# Patient Record
Sex: Female | Born: 1985 | Race: White | Hispanic: No | Marital: Married | Smoking: Never smoker
Health system: Southern US, Community
[De-identification: ages and names within clinical notes are randomized; demographics above are authoritative.]

## PROBLEM LIST (undated history)

## (undated) DIAGNOSIS — D649 Anemia, unspecified: Secondary | ICD-10-CM

## (undated) DIAGNOSIS — R519 Headache, unspecified: Secondary | ICD-10-CM

## (undated) DIAGNOSIS — I471 Supraventricular tachycardia, unspecified: Secondary | ICD-10-CM

## (undated) DIAGNOSIS — T7840XA Allergy, unspecified, initial encounter: Secondary | ICD-10-CM

## (undated) DIAGNOSIS — R87629 Unspecified abnormal cytological findings in specimens from vagina: Secondary | ICD-10-CM

## (undated) DIAGNOSIS — F319 Bipolar disorder, unspecified: Secondary | ICD-10-CM

## (undated) HISTORY — PX: WISDOM TOOTH EXTRACTION: SHX21

## (undated) HISTORY — DX: Bipolar disorder, unspecified: F31.9

## (undated) HISTORY — DX: Anemia, unspecified: D64.9

## (undated) HISTORY — DX: Allergy, unspecified, initial encounter: T78.40XA

---
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
## 2009-10-23 HISTORY — PX: LASIK: SHX215

## 2011-10-31 ENCOUNTER — Ambulatory Visit (INDEPENDENT_AMBULATORY_CARE_PROVIDER_SITE_OTHER): Payer: BC Managed Care – PPO

## 2011-10-31 DIAGNOSIS — R040 Epistaxis: Secondary | ICD-10-CM

## 2011-10-31 DIAGNOSIS — J019 Acute sinusitis, unspecified: Secondary | ICD-10-CM

## 2011-11-06 ENCOUNTER — Ambulatory Visit (INDEPENDENT_AMBULATORY_CARE_PROVIDER_SITE_OTHER): Payer: BC Managed Care – PPO

## 2011-11-06 DIAGNOSIS — G243 Spasmodic torticollis: Secondary | ICD-10-CM

## 2011-11-06 DIAGNOSIS — M542 Cervicalgia: Secondary | ICD-10-CM

## 2012-04-04 ENCOUNTER — Ambulatory Visit: Payer: Self-pay | Admitting: Obstetrics and Gynecology

## 2012-04-30 ENCOUNTER — Ambulatory Visit: Payer: Self-pay | Admitting: Obstetrics and Gynecology

## 2012-05-16 ENCOUNTER — Ambulatory Visit: Payer: Self-pay | Admitting: Obstetrics and Gynecology

## 2012-12-03 ENCOUNTER — Ambulatory Visit: Payer: BC Managed Care – PPO | Admitting: Family Medicine

## 2012-12-03 VITALS — BP 100/70 | HR 88 | Temp 98.7°F | Resp 16 | Ht 65.5 in | Wt 149.6 lb

## 2012-12-03 DIAGNOSIS — N926 Irregular menstruation, unspecified: Secondary | ICD-10-CM

## 2012-12-03 DIAGNOSIS — R5383 Other fatigue: Secondary | ICD-10-CM

## 2012-12-03 DIAGNOSIS — R5381 Other malaise: Secondary | ICD-10-CM

## 2012-12-03 LAB — POCT CBC
Granulocyte percent: 55.6 %G (ref 37–80)
HCT, POC: 38.3 % (ref 37.7–47.9)
Hemoglobin: 12.2 g/dL (ref 12.2–16.2)
Lymph, poc: 2.6 (ref 0.6–3.4)
MCH, POC: 28.2 pg (ref 27–31.2)
MCHC: 31.9 g/dL (ref 31.8–35.4)
MCV: 88.4 fL (ref 80–97)
MID (cbc): 0.4 (ref 0–0.9)
MPV: 7.6 fL (ref 0–99.8)
POC Granulocyte: 3.7 (ref 2–6.9)
POC LYMPH PERCENT: 38.6 %L (ref 10–50)
POC MID %: 5.8 %M (ref 0–12)
Platelet Count, POC: 431 10*3/uL — AB (ref 142–424)
RBC: 4.33 M/uL (ref 4.04–5.48)
RDW, POC: 13 %
WBC: 6.7 10*3/uL (ref 4.6–10.2)

## 2012-12-03 LAB — POCT URINE PREGNANCY: Preg Test, Ur: NEGATIVE

## 2012-12-03 NOTE — Patient Instructions (Addendum)
Thank you for coming in today. Your vaginal bleeding is pretty common with the Implanon.  See GYN in 2-3 weeks if it continues.  You can increase your iron to 2-3x a day if you want.  If you want to lose 10lbs in 10 months eating 125 calories less a day will get there.  You can use myfitness pal website to track calories.  Come back as needed.

## 2012-12-03 NOTE — Progress Notes (Signed)
Gloria Christensen is a 27 y.o. female who presents to [facility] today for irregular menstrual bleeding. Patient has Nexplanon for birth control and previously has been having regular menstrual periods. She had a menstrual period that started on January 24 and on the 31st. However she had a second menstrual period starting February 5 continuing currently. She describes the bleeding as moderate using tampons several times a day.  She's concerned about this. She denies any pain cramping fevers or chills. She does describe some lightheadedness and  slight dizziness but feels consistent with prior episodes of anemia. She's currently taking ferrous gluconate and Centrum with iron.  She feels well otherwise no chest pains or palpitations.    PMH: Reviewed depression History  Substance Use Topics  . Smoking status: Never Smoker   . Smokeless tobacco: Not on file  . Alcohol Use: Not on file   ROS as above  Medications reviewed. Current Outpatient Prescriptions  Medication Sig Dispense Refill  . cetirizine (ZYRTEC) 10 MG tablet Take 10 mg by mouth daily.      . Etonogestrel (IMPLANON [place]) Inject into the skin.      . ferrous sulfate 325 (65 FE) MG tablet Take 325 mg by mouth daily with breakfast.      . lamoTRIgine (LAMICTAL) 100 MG tablet Take 100 mg by mouth daily.      . Multiple Vitamins-Minerals (MULTIVITAMIN WITH MINERALS) tablet Take 1 tablet by mouth daily.       No current facility-administered medications for this visit.    Exam:  BP 100/70  Pulse 88  Temp(Src) 98.7 F (37.1 C) (Oral)  Resp 16  Ht 5' 5.5" (1.664 m)  Wt 149 lb 9.6 oz (67.858 kg)  BMI 24.51 kg/m2  SpO2 100%  LMP 11/27/2012 Gen: Well NAD HEENT: EOMI,  MMM Lungs: CTABL Nl WOB Heart: RRR no MRG Abd: NABS, NT, ND  Exts: Non edematous BL  LE, warm and well perfused.   Results for orders placed in visit on 12/03/12 (from the past 72 hour(s))  POCT CBC     Status: Abnormal   Collection Time    12/03/12  7:22 PM      Result  Value Range   WBC 6.7  4.6 - 10.2 K/uL   Lymph, poc 2.6  0.6 - 3.4   POC LYMPH PERCENT 38.6  10 - 50 %L   MID (cbc) 0.4  0 - 0.9   POC MID % 5.8  0 - 12 %M   POC Granulocyte 3.7  2 - 6.9   Granulocyte percent 55.6  37 - 80 %G   RBC 4.33  4.04 - 5.48 M/uL   Hemoglobin 12.2  12.2 - 16.2 g/dL   HCT, POC 29.5  62.1 - 47.9 %   MCV 88.4  80 - 97 fL   MCH, POC 28.2  27 - 31.2 pg   MCHC 31.9  31.8 - 35.4 g/dL   RDW, POC 30.8     Platelet Count, POC 431 (*) 142 - 424 K/uL   MPV 7.6  0 - 99.8 fL  POCT URINE PREGNANCY     Status: None   Collection Time    12/03/12  7:24 PM      Result Value Range   Preg Test, Ur Negative      Assessment and Plan: 27 y.o. female with irregular menstrual bleeding. Very common side effect of Implanon. If not improving in 2-3 weeks consider followup with GYN for further evaluation and management.  Advised that she is anemic however is reasonable to take extra iron if she would like. Discussed warning signs or symptoms. Please see discharge instructions. Patient expresses understanding. Followup as needed

## 2012-12-04 NOTE — Progress Notes (Signed)
Note reviewed, and agree with documentation and plan.  

## 2012-12-06 ENCOUNTER — Encounter: Payer: Self-pay | Admitting: Obstetrics and Gynecology

## 2012-12-23 ENCOUNTER — Ambulatory Visit: Payer: Self-pay | Admitting: Obstetrics and Gynecology

## 2013-01-08 ENCOUNTER — Other Ambulatory Visit: Payer: Self-pay | Admitting: Obstetrics and Gynecology

## 2013-01-10 LAB — PAP IG, CT-NG, RFX HPV ASCU
Chlamydia Probe Amp: NEGATIVE
GC Probe Amp: NEGATIVE

## 2013-10-11 ENCOUNTER — Ambulatory Visit: Payer: BC Managed Care – PPO | Admitting: Internal Medicine

## 2013-10-11 VITALS — BP 102/60 | HR 84 | Temp 98.8°F | Resp 18 | Ht 67.0 in | Wt 148.0 lb

## 2013-10-11 DIAGNOSIS — F439 Reaction to severe stress, unspecified: Secondary | ICD-10-CM

## 2013-10-11 DIAGNOSIS — R51 Headache: Secondary | ICD-10-CM

## 2013-10-11 DIAGNOSIS — R490 Dysphonia: Secondary | ICD-10-CM

## 2013-10-11 DIAGNOSIS — J039 Acute tonsillitis, unspecified: Secondary | ICD-10-CM

## 2013-10-11 LAB — POCT RAPID STREP A (OFFICE): Rapid Strep A Screen: NEGATIVE

## 2013-10-11 MED ORDER — AZITHROMYCIN 250 MG PO TABS: ORAL_TABLET | ORAL | Status: DC

## 2013-10-11 NOTE — Progress Notes (Signed)
   Subjective:    Patient ID: Gloria Christensen, female    DOB: May 08, 1986, 27 y.o.   MRN: [id number]  HPI Has recurrent ST, exposed to strep, is a Engineer, site. No cough, cp, sob.   Review of Systems     Objective:   Physical Exam  Vitals reviewed. Constitutional: She is oriented to person, place, and time. She appears well-developed and well-nourished. No distress.  HENT:  Head: Normocephalic.  Right Ear: External ear normal.  Left Ear: External ear normal.  Nose: Mucosal edema, rhinorrhea and sinus tenderness present. Right sinus exhibits no maxillary sinus tenderness. Left sinus exhibits no maxillary sinus tenderness.  Mouth/Throat: Mucous membranes are normal. No uvula swelling. Posterior oropharyngeal erythema present.  Cardiovascular: Normal rate and normal heart sounds.   Pulmonary/Chest: Effort normal and breath sounds normal.  Musculoskeletal: Normal range of motion.  Neurological: She is alert and oriented to person, place, and time. She exhibits normal muscle tone. Coordination normal.  Skin: No rash noted.  Psychiatric: She has a normal mood and affect.    RST Results for orders placed in visit on 10/11/13  POCT RAPID STREP A (OFFICE)      Result Value Range   Rapid Strep A Screen Negative  Negative        Assessment & Plan:  Tonsillitis/Stress Zpak if worse

## 2013-10-11 NOTE — Patient Instructions (Addendum)
Tonsillitis Tonsillitis is an infection of the throat that causes the tonsils to become red, tender, and swollen. Tonsils are collections of lymphoid tissue at the back of the throat. Each tonsil has crevices (crypts). Tonsils help fight nose and throat infections and keep infection from spreading to other parts of the body for the first 18 months of life.  CAUSES Sudden (acute) tonsillitis is usually caused by infection with streptococcal bacteria. Long-lasting (chronic) tonsillitis occurs when the crypts of the tonsils become filled with pieces of food and bacteria, which makes it easy for the tonsils to become repeatedly infected. SYMPTOMS  Symptoms of tonsillitis include:  A sore throat, with possible difficulty swallowing.  White patches on the tonsils.  Fever.  Tiredness.  New episodes of snoring during sleep, when you did not snore before.  Small, foul-smelling, yellowish-white pieces of material (tonsilloliths) that you occasionally cough up or spit out. The tonsilloliths can also cause you to have bad breath. DIAGNOSIS Tonsillitis can be diagnosed through a physical exam. Diagnosis can be confirmed with the results of lab tests, including a throat culture. TREATMENT  The goals of tonsillitis treatment include the reduction of the severity and duration of symptoms and prevention of associated conditions. Symptoms of tonsillitis can be improved with the use of steroids to reduce the swelling. Tonsillitis caused by bacteria can be treated with antibiotics. Usually, treatment with antibiotics is started before the cause of the tonsillitis is known. However, if it is determined that the cause is not bacterial, antibiotics will not treat the tonsillitis. If attacks of tonsillitis are severe and frequent, your caregiver may recommend surgery to remove the tonsils (tonsillectomy). HOME CARE INSTRUCTIONS   Rest as much as possible and get plenty of sleep.  Drink plenty of fluids. While the  throat is very sore, eat soft foods or liquids, such as sherbet, soups, or instant breakfast drinks.  Eat frozen ice pops.  Gargle with a warm or cold liquid to help soothe the throat. Mix 1/4 teaspoon of salt and 1/4 teaspoon of baking soda in in 8 oz of water. SEEK MEDICAL CARE IF:   Large, tender lumps develop in your neck.  A rash develops.  A green, yellow-brown, or bloody substance is coughed up.  You are unable to swallow liquids or food for 24 hours.  You notice that only one of the tonsils is swollen. SEEK IMMEDIATE MEDICAL CARE IF:   You develop any new symptoms such as vomiting, severe headache, stiff neck, chest pain, or trouble breathing or swallowing.  You have severe throat pain along with drooling or voice changes.  You have severe pain, unrelieved with recommended medications.  You are unable to fully open the mouth.  You develop redness, swelling, or severe pain anywhere in the neck.  You have a fever. MAKE SURE YOU:   Understand these instructions.  Will watch your condition.  Will get help right away if you are not doing well or get worse. Document Released: 07/19/2005 Document Revised: 06/11/2013 Document Reviewed: 03/28/2013 [facility] Patient Information 2014 [place], [place]. Insomnia Insomnia is frequent trouble falling and/or staying asleep. Insomnia can be a long term problem or a short term problem. Both are common. Insomnia can be a short term problem when the wakefulness is related to a certain stress or worry. Long term insomnia is often related to ongoing stress during waking hours and/or poor sleeping habits. Overtime, sleep deprivation itself can make the problem worse. Every little thing feels more severe because you are  overtired and your ability to cope is decreased. CAUSES   Stress, anxiety, and depression.  Poor sleeping habits.  Distractions such as TV in the bedroom.  Naps close to bedtime.  Engaging in emotionally charged  conversations before bed.  Technical reading before sleep.  Alcohol and other sedatives. They may make the problem worse. They can hurt normal sleep patterns and normal dream activity.  Stimulants such as caffeine for several hours prior to bedtime.  Pain syndromes and shortness of breath can cause insomnia.  Exercise late at night.  Changing time zones may cause sleeping problems (jet lag). It is sometimes helpful to have someone observe your sleeping patterns. They should look for periods of not breathing during the night (sleep apnea). They should also look to see how long those periods last. If you live alone or observers are uncertain, you can also be observed at a sleep clinic where your sleep patterns will be professionally monitored. Sleep apnea requires a checkup and treatment. Give your caregivers your medical history. Give your caregivers observations your family has made about your sleep.  SYMPTOMS   Not feeling rested in the morning.  Anxiety and restlessness at bedtime.  Difficulty falling and staying asleep. TREATMENT   Your caregiver may prescribe treatment for an underlying medical disorders. Your caregiver can give advice or help if you are using alcohol or other drugs for self-medication. Treatment of underlying problems will usually eliminate insomnia problems.  Medications can be prescribed for short time use. They are generally not recommended for lengthy use.  Over-the-counter sleep medicines are not recommended for lengthy use. They can be habit forming.  You can promote easier sleeping by making lifestyle changes such as:  Using relaxation techniques that help with breathing and reduce muscle tension.  Exercising earlier in the day.  Changing your diet and the time of your last meal. No night time snacks.  Establish a regular time to go to bed.  Counseling can help with stressful problems and worry.  Soothing music and white noise may be helpful if  there are background noises you cannot remove.  Stop tedious detailed work at least one hour before bedtime. HOME CARE INSTRUCTIONS   Keep a diary. Inform your caregiver about your progress. This includes any medication side effects. See your caregiver regularly. Take note of:  Times when you are asleep.  Times when you are awake during the night.  The quality of your sleep.  How you feel the next day. This information will help your caregiver care for you.  Get out of bed if you are still awake after 15 minutes. Read or do some quiet activity. Keep the lights down. Wait until you feel sleepy and go back to bed.  Keep regular sleeping and waking hours. Avoid naps.  Exercise regularly.  Avoid distractions at bedtime. Distractions include watching television or engaging in any intense or detailed activity like attempting to balance the household checkbook.  Develop a bedtime ritual. Keep a familiar routine of bathing, brushing your teeth, climbing into bed at the same time each night, listening to soothing music. Routines increase the success of falling to sleep faster.  Use relaxation techniques. This can be using breathing and muscle tension release routines. It can also include visualizing peaceful scenes. You can also help control troubling or intruding thoughts by keeping your mind occupied with boring or repetitive thoughts like the old concept of counting sheep. You can make it more creative like imagining planting one beautiful flower  after another in your backyard garden.  During your day, work to eliminate stress. When this is not possible use some of the previous suggestions to help reduce the anxiety that accompanies stressful situations. MAKE SURE YOU:   Understand these instructions.  Will watch your condition.  Will get help right away if you are not doing well or get worse. Document Released: 10/06/2000 Document Revised: 01/01/2012 Document Reviewed:  11/06/2007 [facility] Patient Information 2014 [place], [place]. Stress Stress-related medical problems are becoming increasingly common. The body has a built-in physical response to stressful situations. Faced with pressure, challenge or danger, we need to react quickly. Our bodies release hormones such as cortisol and adrenaline to help do this. These hormones are part of the "fight or flight" response and affect the metabolic rate, heart rate and blood pressure, resulting in a heightened, stressed state that prepares the body for optimum performance in dealing with a stressful situation. It is likely that early man required these mechanisms to stay alive, but usually modern stresses do not call for this, and the same hormones released in today's world can damage health and reduce coping ability. CAUSES  Pressure to perform at work, at school or in sports.  Threats of physical violence.  Money worries.  Arguments.  Family conflicts.  Divorce or separation from significant other.  Bereavement.  New job or unemployment.  Changes in location.  Alcohol or drug abuse. SOMETIMES, THERE IS NO PARTICULAR REASON FOR DEVELOPING STRESS. Almost all people are at risk of being stressed at some time in their lives. It is important to know that some stress is temporary and some is long term.  Temporary stress will go away when a situation is resolved. Most people can cope with short periods of stress, and it can often be relieved by relaxing, taking a walk, chatting through issues with friends, or having a good night's sleep.  Chronic (long-term, continuous) stress is much harder to deal with. It can be psychologically and emotionally damaging. It can be harmful both for an individual and for friends and family. SYMPTOMS Everyone reacts to stress differently. There are some common effects that help [place] recognize it. In times of extreme stress, people may:  Shake uncontrollably.  Breathe faster  and deeper than normal (hyperventilate).  Vomit.  For people with asthma, stress can trigger an attack.  For some people, stress may trigger migraine headaches, ulcers, and body pain. PHYSICAL EFFECTS OF STRESS MAY INCLUDE:  Loss of energy.  Skin problems.  Aches and pains resulting from tense muscles, including neck ache, backache and tension headaches.  Increased pain from arthritis and other conditions.  Irregular heart beat (palpitations).  Periods of irritability or anger.  Apathy or depression.  Anxiety (feeling uptight or worrying).  Unusual behavior.  Loss of appetite.  Comfort eating.  Lack of concentration.  Loss of, or decreased, sex-drive.  Increased smoking, drinking, or recreational drug use.  For women, missed periods.  Ulcers, joint pain, and muscle pain. Post-traumatic stress is the stress caused by any serious accident, strong emotional damage, or extremely difficult or violent experience such as rape or war. Post-traumatic stress victims can experience mixtures of emotions such as fear, shame, depression, guilt or anger. It may include recurrent memories or images that may be haunting. These feelings can last for weeks, months or even years after the traumatic event that triggered them. Specialized treatment, possibly with medicines and psychological therapies, is available. If stress is causing physical symptoms, severe distress or making  it difficult for you to function as normal, it is worth seeing your caregiver. It is important to remember that although stress is a usual part of life, extreme or prolonged stress can lead to other illnesses that will need treatment. It is better to visit a doctor sooner rather than later. Stress has been linked to the development of high blood pressure and heart disease, as well as insomnia and depression. There is no diagnostic test for stress since everyone reacts to it differently. But a caregiver will be able to  spot the physical symptoms, such as:  Headaches.  Shingles.  Ulcers. Emotional distress such as intense worry, low mood or irritability should be detected when the doctor asks pertinent questions to identify any underlying problems that might be the cause. In case there are physical reasons for the symptoms, the doctor may also want to do some tests to exclude certain conditions. If you feel that you are suffering from stress, try to identify the aspects of your life that are causing it. Sometimes you may not be able to change or avoid them, but even a small change can have a positive ripple effect. A simple lifestyle change can make all the difference. STRATEGIES THAT CAN HELP DEAL WITH STRESS:  Delegating or sharing responsibilities.  Avoiding confrontations.  Learning to be more assertive.  Regular exercise.  Avoid using alcohol or street drugs to cope.  Eating a healthy, balanced diet, rich in fruit and vegetables and proteins.  Finding humor or absurdity in stressful situations.  Never taking on more than you know you can handle comfortably.  Organizing your time better to get as much done as possible.  Talking to friends or family and sharing your thoughts and fears.  Listening to music or relaxation tapes.  Tensing and then relaxing your muscles, starting at the toes and working up to the head and neck. If you think that you would benefit from help, either in identifying the things that are causing your stress or in learning techniques to help you relax, see a caregiver who is capable of helping you with this. Rather than relying on medications, it is usually better to try and identify the things in your life that are causing stress and try to deal with them. There are many techniques of managing stress including counseling, psychotherapy, aromatherapy, yoga, and exercise. Your caregiver can help you determine what is best for you. Document Released: 12/30/2002 Document  Revised: 01/01/2012 Document Reviewed: 11/26/2007 [facility] Patient Information 2014 [place], [place].

## 2015-04-21 ENCOUNTER — Encounter: Payer: Self-pay | Admitting: Podiatry

## 2015-04-21 ENCOUNTER — Ambulatory Visit (INDEPENDENT_AMBULATORY_CARE_PROVIDER_SITE_OTHER): Payer: BC Managed Care – PPO | Admitting: Podiatry

## 2015-04-21 VITALS — BP 115/72 | HR 75 | Ht 65.5 in | Wt 159.0 lb

## 2015-04-21 DIAGNOSIS — M216X1 Other acquired deformities of right foot: Secondary | ICD-10-CM

## 2015-04-21 DIAGNOSIS — M216X2 Other acquired deformities of left foot: Secondary | ICD-10-CM | POA: Diagnosis not present

## 2015-04-21 DIAGNOSIS — M21969 Unspecified acquired deformity of unspecified lower leg: Secondary | ICD-10-CM | POA: Diagnosis not present

## 2015-04-21 NOTE — Progress Notes (Signed)
Subjective: 29 year old female presents stating that all her family members had foot surgeries. Mother advised to have her feet checked.   Review of Systems - Negative.  Objective: Dermatologic: No abnormal skin lesions. Neurologic: All epicritic and tactile sensations grossly intact. Vascular: All pedal pulses are palpable bilateral. Orthopedic: Medially protruding Navicular bone bilateral. Elevated first ray bilateral. Hyperpronation foot with weight bearing bilateral. Normal ankle joint motion.   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 Compensatory STJ hyperpronation bilateral.  Plan: Reviewed clinical findings and available treatment options. May benefit from Custom orthotics. Also reviewed Cotton osteotomy with bone graft to plantar flex the first ray.

## 2015-04-21 NOTE — Patient Instructions (Signed)
Evaluation of both feet reveal elevated first Metatarsal bone.  May benefit from Custom orthotics.

## 2015-05-07 ENCOUNTER — Encounter: Payer: Self-pay | Admitting: Podiatry

## 2015-05-07 ENCOUNTER — Ambulatory Visit (INDEPENDENT_AMBULATORY_CARE_PROVIDER_SITE_OTHER): Payer: BC Managed Care – PPO | Admitting: Podiatry

## 2015-05-07 VITALS — BP 126/73 | HR 89

## 2015-05-07 DIAGNOSIS — M216X9 Other acquired deformities of unspecified foot: Secondary | ICD-10-CM

## 2015-05-07 DIAGNOSIS — M216X1 Other acquired deformities of right foot: Secondary | ICD-10-CM

## 2015-05-07 DIAGNOSIS — M21969 Unspecified acquired deformity of unspecified lower leg: Secondary | ICD-10-CM | POA: Diagnosis not present

## 2015-05-07 DIAGNOSIS — M216X2 Other acquired deformities of left foot: Secondary | ICD-10-CM

## 2015-05-07 NOTE — Progress Notes (Signed)
Subjective: 29 year old female presents to have both feet casted for orthotics. She is a Runner, broadcasting/film/videoteacher and on feet at work. She will require to ask permission to wear lace up tennis shoes.   Objective: No change in findings since the last visit.  Dermatologic: No abnormal skin lesions. Neurologic: All epicritic and tactile sensations grossly intact. Vascular: All pedal pulses are palpable bilateral. Orthopedic: Medially protruding Navicular bone bilateral. Elevated first ray bilateral. Hyperpronation foot with weight bearing bilateral. Normal ankle joint motion.  Radiographic examination reveal elevated first ray with short first metatarsal bones. Minimum change in CYMA line or lateral abduction angles.  No acute changes noted in osseous or articular surfaces.  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 Compensatory STJ hyperpronation bilateral.  Plan: Reviewed clinical findings and available treatment options. Both feet casted for orthotics. Patient was advised to wear lace up leather tennis shoes.

## 2015-05-07 NOTE — Patient Instructions (Addendum)
Casted for orthotics. All questions answered. Should wear firm leather tennis shoes at work.

## 2015-07-05 ENCOUNTER — Ambulatory Visit: Payer: BC Managed Care – PPO | Admitting: Podiatry

## 2015-11-09 ENCOUNTER — Emergency Department (HOSPITAL_COMMUNITY)
Admission: EM | Admit: 2015-11-09 | Discharge: 2015-11-09 | Disposition: A | Discharge status: Home / Self Care | Payer: BC Managed Care – PPO | Source: Home / Self Care | Attending: Emergency Medicine | Admitting: Emergency Medicine

## 2015-11-09 ENCOUNTER — Encounter (HOSPITAL_COMMUNITY): Payer: Self-pay | Admitting: Emergency Medicine

## 2015-11-09 DIAGNOSIS — G44209 Tension-type headache, unspecified, not intractable: Secondary | ICD-10-CM

## 2015-11-09 MED ORDER — KETOROLAC TROMETHAMINE 30 MG/ML IJ SOLN
30.0000 mg | Freq: Once | INTRAMUSCULAR | Status: AC
  Administered 2015-11-09: 30 mg via INTRAMUSCULAR

## 2015-11-09 MED ORDER — KETOROLAC TROMETHAMINE 10 MG PO TABS: 10.0000 mg | ORAL_TABLET | Freq: Four times a day (QID) | ORAL | Status: DC | PRN

## 2015-11-09 MED ORDER — KETOROLAC TROMETHAMINE 30 MG/ML IJ SOLN
INTRAMUSCULAR | Status: AC
  Filled 2015-11-09: qty 1

## 2015-11-09 NOTE — ED Provider Notes (Signed)
CSN: [id number]     Arrival date & time 11/09/15  1859 History   First MD Initiated Contact with Patient 11/09/15 1916     Chief Complaint  Patient presents with  . Migraine   (Consider location/radiation/quality/duration/timing/severity/associated sxs/prior Treatment) HPI Headache onset today 3pm. 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 usually able to take OTC meds and sleep and pain gets better. Pain is across forehead. No vision disturbance. Did get a ride to clinic.  Past Medical History  Diagnosis Date  . Allergy   . Anemia    Past Surgical History  Procedure Laterality Date  . Lasik  2011   No family history on file. Social History  Substance Use Topics  . Smoking status: Never Smoker   . Smokeless tobacco: Never Used  . Alcohol Use: None   OB History    No data available     Review of Systems ROS +'ve Headache  Denies:   NAUSEA, ABDOMINAL PAIN, CHEST PAIN, CONGESTION, DYSURIA, SHORTNESS OF BREATH  Allergies  Ceclor and Sulfur  Home Medications   Prior to Admission medications   Medication Sig Start Date End Date Taking? Authorizing Provider  cetirizine (ZYRTEC) 10 MG tablet Take 10 mg by mouth daily.    Historical Provider, MD  Etonogestrel (IMPLANON [place]) Inject into the skin.    Historical Provider, MD  ferrous sulfate 325 (65 FE) MG tablet Take 325 mg by mouth daily with breakfast.    Historical Provider, MD  lamoTRIgine (LAMICTAL) 100 MG tablet Take 100 mg by mouth daily.    Historical Provider, MD  Multiple Vitamins-Minerals (MULTIVITAMIN WITH MINERALS) tablet Take 1 tablet by mouth daily.    Historical Provider, MD  PRESCRIPTION MEDICATION Low dose birth control pill    Historical Provider, MD   Meds Ordered and Administered this Visit   Medications  ketorolac (TORADOL) 30 MG/ML injection 30 mg (not administered)    BP 126/80 mmHg  Pulse 85  Temp(Src) 98.2 F (36.8 C) (Oral)  Resp 12  SpO2 100%  LMP 11/02/2015 No data  found.   Physical Exam  Constitutional: She is oriented to person, place, and time. She appears well-developed and well-nourished.  HENT:  Head: Normocephalic and atraumatic.  Right Ear: External ear normal.  Left Ear: External ear normal.  Mouth/Throat: Oropharynx is clear and moist.  Eyes: Conjunctivae are normal.  Neck: Normal range of motion. Neck supple.  Cardiovascular: Normal rate.   Pulmonary/Chest: Effort normal and breath sounds normal.  Musculoskeletal: Normal range of motion.  Neurological: She is alert and oriented to person, place, and time. No cranial nerve deficit.  Skin: Skin is warm and dry.  Psychiatric: She has a normal mood and affect. Her behavior is normal. Judgment and thought content normal.  Nursing note and vitals reviewed.   ED Course  Procedures (including critical care time)  Labs Review Labs Reviewed - No data to display  Imaging Review No results found.   Visual Acuity Review  Right Eye Distance:   Left Eye Distance:   Bilateral Distance:    Right Eye Near:   Left Eye Near:    Bilateral Near:        After toradol, pain is much better MDM  No diagnosis found. Patient is advised to continue home symptomatic treatment. Prescription for toradol sent pharmacy patient has indicated. Patient is advised that if there are new or worsening symptoms or attend the emergency department, or contact primary care provider. Instructions of  care provided discharged home in stable condition.  THIS NOTE WAS GENERATED USING A VOICE RECOGNITION SOFTWARE PROGRAM. ALL REASONABLE EFFORTS  WERE MADE TO PROOFREAD THIS DOCUMENT FOR ACCURACY.     Tharon Aquas, PA 11/09/15 2008

## 2015-11-09 NOTE — Discharge Instructions (Signed)
Tension Headache A tension headache is pain, pressure, or aching that is felt over the front and sides of your head. These headaches can last from 30 minutes to several days. HOME CARE Managing Pain  Take over-the-counter and prescription medicines only as told by your doctor.  Lie down in a dark, quiet room when you have a headache.  If directed, apply ice to your head and neck area:  Put ice in a plastic bag.  Place a towel between your skin and the bag.  Leave the ice on for 20 minutes, 2-3 times per day.  Use a heating pad or a hot shower to apply heat to your head and neck area as told by your doctor. Eating and Drinking  Eat meals on a regular schedule.  Do not drink a lot of alcohol.  Do not use a lot of caffeine, or stop using caffeine. General Instructions  Keep all follow-up visits as told by your doctor. This is important.  Keep a journal to find out if certain things bring on headaches. For example, write down:  What you eat and drink.  How much sleep you get.  Any change to your diet or medicines.  Try getting a massage, or doing other things that help you to relax.  Lessen stress.  Sit up straight. Do not tighten (tense) your muscles.  Do not use tobacco products. This includes cigarettes, chewing tobacco, or e-cigarettes. If you need help quitting, ask your doctor.  Exercise regularly as told by your doctor.  Get enough sleep. This may mean 7-9 hours of sleep. GET HELP IF:  Your symptoms are not helped by medicine.  You have a headache that feels different from your usual headache.  You feel sick to your stomach (nauseous) or you throw up (vomit).  You have a fever. GET HELP RIGHT AWAY IF:  Your headache becomes very bad.  You keep throwing up.  You have a stiff neck.  You have trouble seeing.  You have trouble speaking.  You have pain in your eye or ear.  Your muscles are weak or you lose muscle control.  You lose your balance  or you have trouble walking.  You feel like you will pass out (faint) or you pass out.  You have confusion.   This information is not intended to replace advice given to you by your health care provider. Make sure you discuss any questions you have with your health care provider.   Document Released: 01/03/2010 Document Revised: 06/30/2015 Document Reviewed: 02/01/2015 Elsevier Interactive Patient Education 2016 [employer]  

## 2015-11-09 NOTE — ED Notes (Signed)
Pt here with possible tension headache that started today @ 3pm Headache is constant, frontal area radiating to scalp area] Slight nausea noted with vomiting, sensitivity to light or visual disturbance Tried Excedrin, iron supplements without relief Pt is a Engineer, site

## 2015-12-07 ENCOUNTER — Ambulatory Visit (INDEPENDENT_AMBULATORY_CARE_PROVIDER_SITE_OTHER): Payer: BC Managed Care – PPO | Admitting: Urgent Care

## 2015-12-07 VITALS — BP 122/72 | HR 102 | Temp 100.3°F | Resp 18 | Ht 67.0 in | Wt 161.0 lb

## 2015-12-07 DIAGNOSIS — J302 Other seasonal allergic rhinitis: Secondary | ICD-10-CM | POA: Diagnosis not present

## 2015-12-07 DIAGNOSIS — R059 Cough, unspecified: Secondary | ICD-10-CM

## 2015-12-07 DIAGNOSIS — J029 Acute pharyngitis, unspecified: Secondary | ICD-10-CM

## 2015-12-07 DIAGNOSIS — J3489 Other specified disorders of nose and nasal sinuses: Secondary | ICD-10-CM

## 2015-12-07 DIAGNOSIS — R05 Cough: Secondary | ICD-10-CM | POA: Diagnosis not present

## 2015-12-07 LAB — POCT RAPID STREP A (OFFICE): Rapid Strep A Screen: NEGATIVE

## 2015-12-07 MED ORDER — HYDROCODONE-HOMATROPINE 5-1.5 MG/5ML PO SYRP: 5.0000 mL | ORAL_SOLUTION | Freq: Every evening | ORAL | Status: DC | PRN

## 2015-12-07 MED ORDER — BENZONATATE 100 MG PO CAPS: 100.0000 mg | ORAL_CAPSULE | Freq: Three times a day (TID) | ORAL | Status: DC | PRN

## 2015-12-07 NOTE — Patient Instructions (Signed)

## 2015-12-07 NOTE — Progress Notes (Signed)
    MRN: [id number] DOB: 1985/10/28  Subjective:   Gloria Christensen is a 30 y.o. female presenting for chief complaint of Sore Throat and Fever  Reports 1 day history of sore throat, mild cough, sinus congestion, sinus pain, achy teeth, difficulty swallowing. Patient works as a Dispensing optician, would like to r/o strep as suggested by the school nurse. She has a history of seasonal allergies, managed with Zyrtec. Denies smoking cigarettes.  Gloria Christensen has a current medication list which includes the following prescription(s): cetirizine, lamotrigine, magnesium gluconate, multivitamin with minerals, norgestimate-ethinyl estradiol, and ketorolac. Also is allergic to ceclor and sulfur.  Gloria Christensen  has a past medical history of Allergy and Anemia. Also  has past surgical history that includes LASIK (2011).  Objective:   Vitals: BP 122/72 mmHg  Pulse 102  Temp(Src) 100.3 F (37.9 C)  Resp 18  Ht  (1.702 m)  Wt 161 lb (73.029 kg)  BMI 25.21 kg/m2  SpO2 99%  LMP 11/30/2015  Physical Exam  Constitutional: She is oriented to person, place, and time. She appears well-developed and well-nourished.  HENT:  TM's flat bilaterally, no effusions or erythema. Nasal turbinates pink and moist with moderate amount of thick clear mucus. Frontal sinus tenderness. Postnasal drip present, without oropharyngeal exudates, erythema or abscesses.  Eyes: Right eye exhibits no discharge. Left eye exhibits no discharge. No scleral icterus.  Neck: Normal range of motion. Neck supple.  Cardiovascular: Normal rate, regular rhythm and intact distal pulses.  Exam reveals no gallop and no friction rub.   No murmur heard. Pulmonary/Chest: No respiratory distress. She has no wheezes. She has no rales.  Lymphadenopathy:    She has cervical adenopathy (bilateral, anterior).  Neurological: She is alert and oriented to person, place, and time.  Skin: Skin is warm and dry.   Results for orders placed or performed in  visit on 12/07/15 (from the past 24 hour(s))  POCT rapid strep A     Status: None   Collection Time: 12/07/15  5:53 PM  Result Value Ref Range   Rapid Strep A Screen Negative Negative    Assessment and Plan :   1. Acute pharyngitis, unspecified etiology 2. Sore throat 3. Cough - Likely viral in etiology. Recommended supportive care, rtc in 1 week if no improvement.  4. Sinus pain 5. Seasonal allergies - Continue Zyrtec, allergies probably worsened by current illness. RTC if this does not improve with supportive care.  Wallis Bamberg, PA-C [facility] [phone] 12/07/2015 5:21 PM

## 2015-12-09 ENCOUNTER — Encounter: Payer: Self-pay | Admitting: Urgent Care

## 2015-12-09 ENCOUNTER — Ambulatory Visit (INDEPENDENT_AMBULATORY_CARE_PROVIDER_SITE_OTHER): Payer: BC Managed Care – PPO | Admitting: Family Medicine

## 2015-12-09 VITALS — BP 110/70 | HR 99 | Temp 98.6°F | Resp 18 | Wt 162.4 lb

## 2015-12-09 DIAGNOSIS — J069 Acute upper respiratory infection, unspecified: Secondary | ICD-10-CM

## 2015-12-09 DIAGNOSIS — J029 Acute pharyngitis, unspecified: Secondary | ICD-10-CM

## 2015-12-09 DIAGNOSIS — R509 Fever, unspecified: Secondary | ICD-10-CM | POA: Diagnosis not present

## 2015-12-09 LAB — POCT CBC
Granulocyte percent: 81.2 %G — AB (ref 37–80)
HCT, POC: 36.7 % — AB (ref 37.7–47.9)
Hemoglobin: 12.5 g/dL (ref 12.2–16.2)
Lymph, poc: 1.2 (ref 0.6–3.4)
MCH, POC: 29.2 pg (ref 27–31.2)
MCHC: 34.2 g/dL (ref 31.8–35.4)
MCV: 85.5 fL (ref 80–97)
MID (cbc): 0.4 (ref 0–0.9)
MPV: 6.5 fL (ref 0–99.8)
POC Granulocyte: 6.7 (ref 2–6.9)
POC LYMPH PERCENT: 14.1 %L (ref 10–50)
POC MID %: 4.7 %M (ref 0–12)
Platelet Count, POC: 329 10*3/uL (ref 142–424)
RBC: 4.29 M/uL (ref 4.04–5.48)
RDW, POC: 12.7 %
WBC: 8.3 10*3/uL (ref 4.6–10.2)

## 2015-12-09 LAB — CULTURE, GROUP A STREP: Organism ID, Bacteria: NORMAL

## 2015-12-09 LAB — POCT RAPID STREP A (OFFICE): Rapid Strep A Screen: NEGATIVE

## 2015-12-09 MED ORDER — AZITHROMYCIN 250 MG PO TABS: ORAL_TABLET | ORAL | Status: DC

## 2015-12-09 MED ORDER — MAGIC MOUTHWASH W/LIDOCAINE: 5.0000 mL | Freq: Four times a day (QID) | ORAL | Status: DC | PRN

## 2015-12-09 NOTE — Progress Notes (Addendum)
Subjective:  By signing my name below, I, Rawaa Al Rifaie, attest that this documentation has been prepared under the direction and in the presence of Meredith Staggers, MD.  Broadus John, Medical Scribe. 12/09/2015.  11:08 AM.   Patient ID: Gloria Christensen, female    DOB: 1986/04/23, 30 y.o.   MRN: [id number]  Chief Complaint  Patient presents with  . Sore Throat    x4 days, throat is still painful    HPI HPI Comments: Gloria Christensen is a 30 y.o. female who presents to [facility] complaining of sore throat, onset 4 days ago. Pt was seen 2 days ago with a sore throat and fever starting one day prior, accompanied by cough and congestion. She had a negative rapid strep test, suspected viral illness. She also had a negative throat culture. Symptomatic care was discussed.  Today, pt reports that her sore throat is consistent, and has gotten worse. She indicates that she has sever difficulty swallowing, and has associated burning sensation with it. She still has fever or 99 degrees this morning, her temperature is normal here at the office. Pt also still present with cough that is only productive of green-yellow mucus at shower time, in addition to congestion. She reports no sick contact. Pt is compliant with taking the hycodan cough syrup at bed time, and reports finding relief with it for about 3 hours. She is also compliant with taking the tessalon perles, however she indicates that she does not find much relief with it. She is not UTD with the flu vaccine. She denies difficulty urinating.   Pt reports experiencing addiction symptom with sleeping medication, however no problems with narcotic medications.   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    Patient Active Problem List   Diagnosis Date Noted  . 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 Other malaise and fatigue 12/03/2012  . Irregular menstrual bleeding  12/03/2012   Past Medical History  Diagnosis Date  . Allergy   . Anemia    Past Surgical History  Procedure Laterality Date  . Lasik  2011   Allergies  Allergen Reactions  . Ceclor [Cefaclor] Hives  . Sulfur    Prior to Admission medications   Medication Sig Start Date End Date Taking? Authorizing Provider  benzonatate (TESSALON) 100 MG capsule Take 1-2 capsules (100-200 mg total) by mouth 3 (three) times daily as needed for cough. 12/07/15  Yes Wallis Bamberg, PA-C  cetirizine (ZYRTEC) 10 MG tablet Take 10 mg by mouth daily.   Yes Historical Provider, MD  HYDROcodone-homatropine (HYCODAN) 5-1.5 MG/5ML syrup Take 5 mLs by mouth at bedtime as needed. 12/07/15  Yes Wallis Bamberg, PA-C  lamoTRIgine (LAMICTAL) 100 MG tablet Take 100 mg by mouth daily.   Yes Historical Provider, MD  magnesium gluconate (MAGONATE) 30 MG tablet Take 30 mg by mouth 2 (two) times daily.   Yes Historical Provider, MD  Multiple Vitamins-Minerals (MULTIVITAMIN WITH MINERALS) tablet Take 1 tablet by mouth daily.   Yes Historical Provider, MD  norgestimate-ethinyl estradiol (ORTHO-CYCLEN,SPRINTEC,PREVIFEM) 0.25-35 MG-MCG tablet Take 1 tablet by mouth daily.   Yes Historical Provider, MD  ketorolac (TORADOL) 10 MG tablet Take 1 tablet (10 mg total) by mouth every 6 (six) hours as needed. Patient not taking: Reported on 12/07/2015 11/09/15   Tharon Aquas, PA   Social History   Social History  . Marital Status: Single    Spouse Name:  N/A  . Number of Children: N/A  . Years of Education: N/A   Occupational History  . Not on file.   Social History Main Topics  . Smoking status: Never Smoker   . Smokeless tobacco: Never Used  . Alcohol Use: Not on file  . Drug Use: Not on file  . Sexual Activity: Not on file   Other Topics Concern  . Not on file   Social History Narrative    Review of Systems  Constitutional: Positive for fever.  HENT: Positive for congestion, sore throat and trouble swallowing.     Respiratory: Positive for cough.   Genitourinary: Negative for difficulty urinating.      Objective:   Physical Exam  Constitutional: She is oriented to person, place, and time. She appears well-developed and well-nourished. No distress.  HENT:  Head: Normocephalic and atraumatic.  Question of faint exudate on the left tonsillar recess with some erythema BL. I do not see any abscess. No uvula shift. Does not appear to be significant hypertrophy.  Eyes: EOM are normal. Pupils are equal, round, and reactive to light.  Neck: Neck supple.  No lymphadenopathy.  Small tender node on the left neck.   Cardiovascular: Normal rate, regular rhythm and normal heart sounds.  Exam reveals no gallop and no friction rub.   No murmur heard. Pulmonary/Chest: Effort normal and breath sounds normal. No respiratory distress. She has no wheezes. She has no rales.  Abdominal: There is tenderness (Minimal right sided abdominal tenderness). There is no rebound and no guarding.  Neurological: She is alert and oriented to person, place, and time. No cranial nerve deficit.  Skin: Skin is warm and dry.  Psychiatric: She has a normal mood and affect. Her behavior is normal.  Nursing note and vitals reviewed.   Filed Vitals:   12/09/15 1049  BP: 110/70  Pulse: 99  Temp: 98.6 F (37 C)  TempSrc: Oral  Resp: 18  Weight: 162 lb 6.4 oz (73.664 kg)  SpO2: 98%   Results for orders placed or performed in visit on 12/09/15  POCT CBC  Result Value Ref Range   WBC 8.3 4.6 - 10.2 K/uL   Lymph, poc 1.2 0.6 - 3.4   POC LYMPH PERCENT 14.1 10 - 50 %L   MID (cbc) 0.4 0 - 0.9   POC MID % 4.7 0 - 12 %M   POC Granulocyte 6.7 2 - 6.9   Granulocyte percent 81.2 (A) 37 - 80 %G   RBC 4.29 4.04 - 5.48 M/uL   Hemoglobin 12.5 12.2 - 16.2 g/dL   HCT, POC 40.9 (A) 81.1 - 47.9 %   MCV 85.5 80 - 97 fL   MCH, POC 29.2 27 - 31.2 pg   MCHC 34.2 31.8 - 35.4 g/dL   RDW, POC 91.4 %   Platelet Count, POC 329 142 - 424 K/uL   MPV  6.5 0 - 99.8 fL  POCT rapid strep A  Result Value Ref Range   Rapid Strep A Screen Negative Negative       Assessment & Plan:   Gloria Christensen is a 30 y.o. female Pharyngitis - Plan: POCT rapid strep A, magic mouthwash w/lidocaine SOLN, azithromycin (ZITHROMAX) 250 MG tablet, DISCONTINUED: azithromycin (ZITHROMAX) 250 MG tablet  Acute upper respiratory infection  Fever and chills - Plan: POCT CBC, POCT rapid strep A  Still suspected viral illness, continue symptomatic care, prescription given for Magic mouthwash and discuss other sore throat care. However if not improving  the next few days, prescription for azithromycin to cover for secondary bacterial infection was prescribed. Side effects discussed and RTC precautions discussed.  Meds ordered this encounter  Medications  . magic mouthwash w/lidocaine SOLN    Sig: Take 5 mLs by mouth 4 (four) times daily as needed for mouth pain.    Dispense:  120 mL    Refill:  0    Ok to substitute ingredients per pharmacy usual "magic mouthwash" prep.  Marland Kitchen DISCONTD: azithromycin (ZITHROMAX) 250 MG tablet    Sig: Take 2 pills by mouth on day 1, then 1 pill by mouth per day on days 2 through 5.    Dispense:  6 tablet    Refill:  0  . azithromycin (ZITHROMAX) 250 MG tablet    Sig: Take 2 pills by mouth on day 1, then 1 pill by mouth per day on days 2 through 5.    Dispense:  6 tablet    Refill:  0   Patient Instructions   Magic mouthwash as needed throughout the day, ibuprofen (you can use the children's or liquid version with adult dose of  if needed)  4-6 hours, and the hydrocodone cough syrup that was prescribed can be used every 6 hours as needed.   Your blood count was overall reassuring, and strep test was negative again today. If not improving into the next 2 days, can start antibiotic that was prescribed today, but right now your symptoms still appear to be due to a virus. If you're having increasing swelling or difficulty keeping  fluids down or other worsening pain into this weekend, recommend recheck.  Return to the clinic or go to the nearest emergency room if any of your symptoms worsen or new symptoms occur.   Sore Throat A sore throat is pain, burning, irritation, or scratchiness of the throat. There is often pain or tenderness when swallowing or talking. A sore throat may be accompanied by other symptoms, such as coughing, sneezing, fever, and swollen neck glands. A sore throat is often the first sign of another sickness, such as a cold, flu, strep throat, or mononucleosis (commonly known as mono). Most sore throats go away without medical treatment. CAUSES  The most common causes of a sore throat include:  A viral infection, such as a cold, flu, or mono.  A bacterial infection, such as strep throat, tonsillitis, or whooping cough.  Seasonal allergies.  Dryness in the air.  Irritants, such as smoke or pollution.  Gastroesophageal reflux disease (GERD). HOME CARE INSTRUCTIONS   Only take over-the-counter medicines as directed by your caregiver.  Drink enough fluids to keep your urine clear or pale yellow.  Rest as needed.  Try using throat sprays, lozenges, or sucking on hard candy to ease any pain (if older than 4 years or as directed).  Sip warm liquids, such as broth, herbal tea, or warm water with honey to relieve pain temporarily. You may also eat or drink cold or frozen liquids such as frozen ice pops.  Gargle with salt water (mix 1 tsp salt with 8 oz of water).  Do not smoke and avoid secondhand smoke.  Put a cool-mist humidifier in your bedroom at night to moisten the air. You can also turn on a hot shower and sit in the bathroom with the door closed for 5-10 minutes. SEEK IMMEDIATE MEDICAL CARE IF:  You have difficulty breathing.  You are unable to swallow fluids, soft foods, or your saliva.  You have increased swelling  in the throat.  Your sore throat does not get better in 7  days.  You have nausea and vomiting.  You have a fever or persistent symptoms for more than 2-3 days.  You have a fever and your symptoms suddenly get worse. MAKE SURE YOU:   Understand these instructions.  Will watch your condition.  Will get help right away if you are not doing well or get worse.   This information is not intended to replace advice given to you by your health care provider. Make sure you discuss any questions you have with your health care provider.   Document Released: 11/16/2004 Document Revised: 10/30/2014 Document Reviewed: 06/16/2012 Elsevier Interactive Patient Education 2016 Else[employer] Inc[employer]  Upper Respiratory Infection, Adult Most upper respiratory infections (URIs) are a viral infection of the air passages leading to the lungs. A URI affects the nose, throat, and upper air passages. The most common type of URI is nasopharyngitis and is typically referred to as "the common cold." URIs run their course and usually go away on their own. Most of the time, a URI does not require medical attention, but sometimes a bacterial infection in the upper airways can follow a viral infection. This is called a secondary infection. Sinus and middle ear infections are common types of secondary upper respiratory infections. Bacterial pneumonia can also complicate a URI. A URI can worsen asthma and chronic obstructive pulmonary disease (COPD). Sometimes, these complications can require emergency medical care and may be life threatening.  CAUSES Almost all URIs are caused by viruses. A virus is a type of germ and can spread from one person to another.  RISKS FACTORS You may be at risk for a URI if:   You smoke.   You have chronic heart or lung disease.  You have a weakened defense (immune) system.   You are very young or very old.   You have nasal allergies or asthma.  You work in crowded or poorly ventilated areas.  You work in health care facilities or  schools. SIGNS AND SYMPTOMS  Symptoms typically develop 2-3 days after you come in contact with a cold virus. Most viral URIs last 7-10 days. However, viral URIs from the influenza virus (flu virus) can last 14-18 days and are typically more severe. Symptoms may include:   Runny or stuffy (congested) nose.   Sneezing.   Cough.   Sore throat.   Headache.   Fatigue.   Fever.   Loss of appetite.   Pain in your forehead, behind your eyes, and over your cheekbones (sinus pain).  Muscle aches.  DIAGNOSIS  Your health care provider may diagnose a URI by:  Physical exam.  Tests to check that your symptoms are not due to another condition such as:  Strep throat.  Sinusitis.  Pneumonia.  Asthma. TREATMENT  A URI goes away on its own with time. It cannot be cured with medicines, but medicines may be prescribed or recommended to relieve symptoms. Medicines may help:  Reduce your fever.  Reduce your cough.  Relieve nasal congestion. HOME CARE INSTRUCTIONS   Take medicines only as directed by your health care provider.   Gargle warm saltwater or take cough drops to comfort your throat as directed by your health care provider.  Use a warm mist humidifier or inhale steam from a shower to increase air moisture. This may make it easier to breathe.  Drink enough fluid to keep your urine clear or pale yellow.   Eat soups and  other clear broths and maintain good nutrition.   Rest as needed.   Return to work when your temperature has returned to normal or as your health care provider advises. You may need to stay home longer to avoid infecting others. You can also use a face mask and careful hand washing to prevent spread of the virus.  Increase the usage of your inhaler if you have asthma.   Do not use any tobacco products, including cigarettes, chewing tobacco, or electronic cigarettes. If you need help quitting, ask your health care provider. PREVENTION   The best way to protect yourself from getting a cold is to practice good hygiene.   Avoid oral or hand contact with people with cold symptoms.   Wash your hands often if contact occurs.  There is no clear evidence that vitamin C, vitamin E, echinacea, or exercise reduces the chance of developing a cold. However, it is always recommended to get plenty of rest, exercise, and practice good nutrition.  SEEK MEDICAL CARE IF:   You are getting worse rather than better.   Your symptoms are not controlled by medicine.   You have chills.  You have worsening shortness of breath.  You have brown or red mucus.  You have yellow or brown nasal discharge.  You have pain in your face, especially when you bend forward.  You have a fever.  You have swollen neck glands.  You have pain while swallowing.  You have white areas in the back of your throat. SEEK IMMEDIATE MEDICAL CARE IF:   You have severe or persistent:  Headache.  Ear pain.  Sinus pain.  Chest pain.  You have chronic lung disease and any of the following:  Wheezing.  Prolonged cough.  Coughing up blood.  A change in your usual mucus.  You have a stiff neck.  You have changes in your:  Vision.  Hearing.  Thinking.  Mood. MAKE SURE YOU:   Understand these instructions.  Will watch your condition.  Will get help right away if you are not doing well or get worse.   This information is not intended to replace advice given to you by your health care provider. Make sure you discuss any questions you have with your health care provider.   Document Released: 04/04/2001 Document Revised: 02/23/2015 Document Reviewed: 01/14/2014 Elsevier Interactive Patient Education [employer]       I personally performed the services described in this documentation, which was scribed in my presence. The recorded information has been reviewed and considered, and addended by me as needed.

## 2015-12-09 NOTE — Patient Instructions (Signed)
Magic mouthwash as needed throughout the day, ibuprofen (you can use the children's or liquid version with adult dose of 400mg  if needed)  4-6 hours, and the hydrocodone cough syrup that was prescribed can be used every 6 hours as needed.   Your blood count was overall reassuring, and strep test was negative again today. If not improving into the next 2 days, can start antibiotic that was prescribed today, but right now your symptoms still appear to be due to a virus. If you're having increasing swelling or difficulty keeping fluids down or other worsening pain into this weekend, recommend recheck.  Return to the clinic or go to the nearest emergency room if any of your symptoms worsen or new symptoms occur.   Sore Throat A sore throat is pain, burning, irritation, or scratchiness of the throat. There is often pain or tenderness when swallowing or talking. A sore throat may be accompanied by other symptoms, such as coughing, sneezing, fever, and swollen neck glands. A sore throat is often the first sign of another sickness, such as a cold, flu, strep throat, or mononucleosis (commonly known as mono). Most sore throats go away without medical treatment. CAUSES  The most common causes of a sore throat include:  A viral infection, such as a cold, flu, or mono.  A bacterial infection, such as strep throat, tonsillitis, or whooping cough.  Seasonal allergies.  Dryness in the air.  Irritants, such as smoke or pollution.  Gastroesophageal reflux disease (GERD). HOME CARE INSTRUCTIONS   Only take over-the-counter medicines as directed by your caregiver.  Drink enough fluids to keep your urine clear or pale yellow.  Rest as needed.  Try using throat sprays, lozenges, or sucking on hard candy to ease any pain (if older than 4 years or as directed).  Sip warm liquids, such as broth, herbal tea, or warm water with honey to relieve pain temporarily. You may also eat or drink cold or frozen  liquids such as frozen ice pops.  Gargle with salt water (mix 1 tsp salt with 8 oz of water).  Do not smoke and avoid secondhand smoke.  Put a cool-mist humidifier in your bedroom at night to moisten the air. You can also turn on a hot shower and sit in the bathroom with the door closed for 5-10 minutes. SEEK IMMEDIATE MEDICAL CARE IF:  You have difficulty breathing.  You are unable to swallow fluids, soft foods, or your saliva.  You have increased swelling in the throat.  Your sore throat does not get better in 7 days.  You have nausea and vomiting.  You have a fever or persistent symptoms for more than 2-3 days.  You have a fever and your symptoms suddenly get worse. MAKE SURE YOU:   Understand these instructions.  Will watch your condition.  Will get help right away if you are not doing well or get worse.   This information is not intended to replace advice given to you by your health care provider. Make sure you discuss any questions you have with your health care provider.   Document Released: 11/16/2004 Document Revised: 10/30/2014 Document Reviewed: 06/16/2012 Elsevier Interactive Patient Education 2016 [employer] Inc.  Upper Respiratory Infection, Adult Most upper respiratory infections (URIs) are a viral infection of the air passages leading to the lungs. A URI affects the nose, throat, and upper air passages. The most common type of URI is nasopharyngitis and is typically referred to as "the common cold." URIs run their course  and usually go away on their own. Most of the time, a URI does not require medical attention, but sometimes a bacterial infection in the upper airways can follow a viral infection. This is called a secondary infection. Sinus and middle ear infections are common types of secondary upper respiratory infections. Bacterial pneumonia can also complicate a URI. A URI can worsen asthma and chronic obstructive pulmonary disease (COPD). Sometimes, these  complications can require emergency medical care and may be life threatening.  CAUSES Almost all URIs are caused by viruses. A virus is a type of germ and can spread from one person to another.  RISKS FACTORS You may be at risk for a URI if:   You smoke.   You have chronic heart or lung disease.  You have a weakened defense (immune) system.   You are very young or very old.   You have nasal allergies or asthma.  You work in crowded or poorly ventilated areas.  You work in health care facilities or schools. SIGNS AND SYMPTOMS  Symptoms typically develop 2-3 days after you come in contact with a cold virus. Most viral URIs last 7-10 days. However, viral URIs from the influenza virus (flu virus) can last 14-18 days and are typically more severe. Symptoms may include:   Runny or stuffy (congested) nose.   Sneezing.   Cough.   Sore throat.   Headache.   Fatigue.   Fever.   Loss of appetite.   Pain in your forehead, behind your eyes, and over your cheekbones (sinus pain).  Muscle aches.  DIAGNOSIS  Your health care provider may diagnose a URI by:  Physical exam.  Tests to check that your symptoms are not due to another condition such as:  Strep throat.  Sinusitis.  Pneumonia.  Asthma. TREATMENT  A URI goes away on its own with time. It cannot be cured with medicines, but medicines may be prescribed or recommended to relieve symptoms. Medicines may help:  Reduce your fever.  Reduce your cough.  Relieve nasal congestion. HOME CARE INSTRUCTIONS   Take medicines only as directed by your health care provider.   Gargle warm saltwater or take cough drops to comfort your throat as directed by your health care provider.  Use a warm mist humidifier or inhale steam from a shower to increase air moisture. This may make it easier to breathe.  Drink enough fluid to keep your urine clear or pale yellow.   Eat soups and other clear broths and maintain  good nutrition.   Rest as needed.   Return to work when your temperature has returned to normal or as your health care provider advises. You may need to stay home longer to avoid infecting others. You can also use a face mask and careful hand washing to prevent spread of the virus.  Increase the usage of your inhaler if you have asthma.   Do not use any tobacco products, including cigarettes, chewing tobacco, or electronic cigarettes. If you need help quitting, ask your health care provider. PREVENTION  The best way to protect yourself from getting a cold is to practice good hygiene.   Avoid oral or hand contact with people with cold symptoms.   Wash your hands often if contact occurs.  There is no clear evidence that vitamin C, vitamin E, echinacea, or exercise reduces the chance of developing a cold. However, it is always recommended to get plenty of rest, exercise, and practice good nutrition.  SEEK MEDICAL CARE IF:  You are getting worse rather than better.   Your symptoms are not controlled by medicine.   You have chills.  You have worsening shortness of breath.  You have brown or red mucus.  You have yellow or brown nasal discharge.  You have pain in your face, especially when you bend forward.  You have a fever.  You have swollen neck glands.  You have pain while swallowing.  You have white areas in the back of your throat. SEEK IMMEDIATE MEDICAL CARE IF:   You have severe or persistent:  Headache.  Ear pain.  Sinus pain.  Chest pain.  You have chronic lung disease and any of the following:  Wheezing.  Prolonged cough.  Coughing up blood.  A change in your usual mucus.  You have a stiff neck.  You have changes in your:  Vision.  Hearing.  Thinking.  Mood. MAKE SURE YOU:   Understand these instructions.  Will watch your condition.  Will get help right away if you are not doing well or get worse.   This information is not  intended to replace advice given to you by your health care provider. Make sure you discuss any questions you have with your health care provider.   Document Released: 04/04/2001 Document Revised: 02/23/2015 Document Reviewed: 01/14/2014 Elsevier Interactive Patient Education [employer]

## 2016-02-11 ENCOUNTER — Ambulatory Visit (INDEPENDENT_AMBULATORY_CARE_PROVIDER_SITE_OTHER): Payer: BC Managed Care – PPO | Admitting: Physician Assistant

## 2016-02-11 VITALS — BP 108/70 | HR 104 | Temp 99.1°F | Resp 16 | Ht 65.5 in | Wt 162.4 lb

## 2016-02-11 DIAGNOSIS — A020 Salmonella enteritis: Secondary | ICD-10-CM | POA: Diagnosis not present

## 2016-02-11 DIAGNOSIS — R197 Diarrhea, unspecified: Secondary | ICD-10-CM | POA: Diagnosis not present

## 2016-02-11 LAB — POCT URINALYSIS DIP (MANUAL ENTRY)
Bilirubin, UA: NEGATIVE
Blood, UA: NEGATIVE
Glucose, UA: NEGATIVE
Leukocytes, UA: NEGATIVE
Nitrite, UA: NEGATIVE
Protein Ur, POC: NEGATIVE
Spec Grav, UA: 1.015
Urobilinogen, UA: 0.2
pH, UA: 6.5

## 2016-02-11 LAB — POCT CBC
Granulocyte percent: 69.7 %G (ref 37–80)
HCT, POC: 34.7 % — AB (ref 37.7–47.9)
Hemoglobin: 12.1 g/dL — AB (ref 12.2–16.2)
Lymph, poc: 2.1 (ref 0.6–3.4)
MCH, POC: 29.4 pg (ref 27–31.2)
MCHC: 35 g/dL (ref 31.8–35.4)
MCV: 84 fL (ref 80–97)
MID (cbc): 0.9 (ref 0–0.9)
MPV: 6.5 fL (ref 0–99.8)
POC Granulocyte: 6.8 (ref 2–6.9)
POC LYMPH PERCENT: 21.4 %L (ref 10–50)
POC MID %: 8.9 %M (ref 0–12)
Platelet Count, POC: 337 10*3/uL (ref 142–424)
RBC: 4.12 M/uL (ref 4.04–5.48)
RDW, POC: 12.7 %
WBC: 9.8 10*3/uL (ref 4.6–10.2)

## 2016-02-11 NOTE — Progress Notes (Signed)
02/16/2016 8:12 AM   DOB: 09-15-1986 / MRN: [id number]  SUBJECTIVE:  Gloria Christensen is a 30 y.o. female presenting for diarrhea that has been present for roughly 5 days now.  Associates lower abdominal pain before the episodes of diarrhea and the pain is relieved by defecation. She does not feel that she is getting worse or better.  Has been taking immodium with excellent relief of her symptoms.  Denies dysuria and abnormal vaginal discharge.  She has had no antibiotics in the last year.    She is allergic to ceclor and sulfur.   She  has a past medical history of Allergy and Anemia.    She  reports that she has never smoked. She has never used smokeless tobacco. She  has no sexual activity history on file. The patient  has past surgical history that includes LASIK (2011).  Her family history is not on file.  Review of Systems  Constitutional: Negative for fever and chills.  Cardiovascular: Negative for chest pain.  Gastrointestinal: Positive for nausea, abdominal pain and diarrhea. Negative for vomiting and constipation.  Genitourinary: Negative for dysuria, urgency and frequency.  Musculoskeletal: Negative for myalgias.  Skin: Negative for rash.  Neurological: Negative for dizziness and headaches.    Problem list and medications reviewed and updated by myself where necessary, and exist elsewhere in the encounter.   OBJECTIVE:  BP 108/70 mmHg  Pulse 104  Temp(Src) 99.1 F (37.3 C) (Oral)  Resp 16  Ht 5' 5.5" (1.664 m)  Wt 162 lb 6.4 oz (73.664 kg)  BMI 26.60 kg/m2  SpO2 98%  LMP 01/24/2016  Physical Exam  Constitutional: She is oriented to person, place, and time. She appears well-nourished. No distress.  Eyes: EOM are normal. Pupils are equal, round, and reactive to light.  Cardiovascular: Normal rate and regular rhythm.   Pulmonary/Chest: Effort normal and breath sounds normal.  Abdominal: She exhibits no distension.  Neurological: She is alert and oriented to person,  place, and time. No cranial nerve deficit. Gait normal.  Skin: Skin is dry. She is not diaphoretic. There is pallor.  Psychiatric: She has a normal mood and affect.  Vitals reviewed.  Results for orders placed or performed in visit on 02/11/16  COMPLETE METABOLIC PANEL WITH GFR  Result Value Ref Range   Sodium 136 135 - 146 mmol/L   Potassium 4.5 3.5 - 5.3 mmol/L   Chloride 100 98 - 110 mmol/L   CO2 26 20 - 31 mmol/L   Glucose, Bld 91 65 - 99 mg/dL   BUN 7 7 - 25 mg/dL   Creat 4.090.66 8.110.50 - 9.141.10 mg/dL   Total Bilirubin 0.3 0.2 - 1.2 mg/dL   Alkaline Phosphatase 54 33 - 115 U/L   AST 14 10 - 30 U/L   ALT 11 6 - 29 U/L   Total Protein 7.6 6.1 - 8.1 g/dL   Albumin 4.1 3.6 - 5.1 g/dL   Calcium 9.2 8.6 - 78.210.2 mg/dL   GFR, Est African American >89 >=60 mL/min   GFR, Est Non African American >89 >=60 mL/min  Gastrointestinal Pathogen Panel PCR  Result Value Ref Range   Campylobacter, PCR Not Detected    C. difficile Tox A/B, PCR Not Detected    E coli 0157, PCR Not Detected    E coli (ETEC) LT/ST PCR Not Detected    E coli (STEC) stx1/stx2, PCR Not Detected    Salmonella, PCR Detected (AA)    Shigella, PCR Not Detected  Norovirus, PCR Not Detected    Rotavirus A, PCR Not Detected    Giardia lamblia, PCR Not Detected    Cryptosporidium, PCR Not Detected   POCT CBC  Result Value Ref Range   WBC 9.8 4.6 - 10.2 K/uL   Lymph, poc 2.1 0.6 - 3.4   POC LYMPH PERCENT 21.4 10 - 50 %L   MID (cbc) 0.9 0 - 0.9   POC MID % 8.9 0 - 12 %M   POC Granulocyte 6.8 2 - 6.9   Granulocyte percent 69.7 37 - 80 %G   RBC 4.12 4.04 - 5.48 M/uL   Hemoglobin 12.1 (A) 12.2 - 16.2 g/dL   HCT, POC 16.1 (A) 09.6 - 47.9 %   MCV 84.0 80 - 97 fL   MCH, POC 29.4 27 - 31.2 pg   MCHC 35.0 31.8 - 35.4 g/dL   RDW, POC 04.5 %   Platelet Count, POC 337 142 - 424 K/uL   MPV 6.5 0 - 99.8 fL  POCT urinalysis dipstick  Result Value Ref Range   Color, UA yellow yellow   Clarity, UA clear clear   Glucose, UA  negative negative   Bilirubin, UA negative negative   Ketones, POC UA trace (5) (A) negative   Spec Grav, UA 1.015    Blood, UA negative negative   pH, UA 6.5    Protein Ur, POC negative negative   Urobilinogen, UA 0.2    Nitrite, UA Negative Negative   Leukocytes, UA Negative Negative    No results found.  ASSESSMENT AND PLAN  Gloria Christensen was seen today for diarrhea.  Diagnoses and all orders for this visit:  Diarrhea, unspecified type -     POCT CBC -     COMPLETE METABOLIC PANEL WITH GFR -     POCT urinalysis dipstick -     Gastrointestinal Pathogen Panel PCR  Salmonella enteritis: I spoke with patient via phone and she is improved with brat diet and is not needing immodium at this time.  Will contact ID for some guidance.  Will add on stool culture and make will make health department aware of the situation.  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 It is also possible that she may have chronic salmonella and picked up a virus which had remissed by the time the stool pathogen panel was collected. Will bring patient back in for stool cultures and blood work. Future orders placed in the system.      The patient was advised to call or return to clinic if she does not see an improvement in symptoms or to seek the care of the closest emergency department if she worsens with the above plan.   Deliah Boston, MHS, PA-C [facility] 02/16/2016 8:12 AM

## 2016-02-11 NOTE — Patient Instructions (Addendum)
IF you received an x-ray today, you will receive an invoice from [facility]. Please contact [facility] at [phone] with questions or concerns regarding your invoice.   IF you received labwork today, you will receive an invoice from [employer]. Please contact [facility] at [phone] with questions or concerns regarding your invoice.   Our billing staff will not be able to assist you with questions regarding bills from these companies.  You will be contacted with the lab results as soon as they are available. The fastest way to get your results is to activate your My Chart account. Instructions are located on the last page of this paperwork. If you have not heard from [place] regarding the results in 2 weeks, please contact this office.    Food Choices to Help Relieve Diarrhea, Adult When you have diarrhea, the foods you eat and your eating habits are very important. Choosing the right foods and drinks can help relieve diarrhea. Also, because diarrhea can last up to 7 days, you need to replace lost fluids and electrolytes (such as sodium, potassium, and chloride) in order to help prevent dehydration.  WHAT GENERAL GUIDELINES DO I NEED TO FOLLOW?  Slowly drink 1 cup (8 oz) of fluid for each episode of diarrhea. If you are getting enough fluid, your urine will be clear or pale yellow.  Eat starchy foods. Some good choices include white rice, white toast, pasta, low-fiber cereal, baked potatoes (without the skin), saltine crackers, and bagels.  Avoid large servings of any cooked vegetables.  Limit fruit to two servings per day. A serving is  cup or 1 small piece.  Choose foods with less than 2 g of fiber per serving.  Limit fats to less than 8 tsp (38 g) per day.  Avoid fried foods.  Eat foods that have probiotics in them. Probiotics can be found in certain dairy products.  Avoid foods and beverages that may increase the speed at which  food moves through the stomach and intestines (gastrointestinal tract). Things to avoid include:  High-fiber foods, such as dried fruit, raw fruits and vegetables, nuts, seeds, and whole grain foods.  Spicy foods and high-fat foods.  Foods and beverages sweetened with high-fructose corn syrup, honey, or sugar alcohols such as xylitol, sorbitol, and mannitol. WHAT FOODS ARE RECOMMENDED? Grains White rice. White, [place], or pita breads (fresh or toasted), including plain rolls, buns, or bagels. White pasta. Saltine, soda, or graham crackers. Pretzels. Low-fiber cereal. Cooked cereals made with water (such as cornmeal, farina, or cream cereals). Plain muffins. Matzo. Melba toast. Zwieback.  Vegetables Potatoes (without the skin). Strained tomato and vegetable juices. Most well-cooked and canned vegetables without seeds. Tender lettuce. Fruits Cooked or canned applesauce, apricots, cherries, fruit cocktail, grapefruit, peaches, pears, or plums. Fresh bananas, apples without skin, cherries, grapes, cantaloupe, grapefruit, peaches, oranges, or plums.  Meat and Other Protein Products Baked or boiled chicken. Eggs. Tofu. Fish. Seafood. Smooth peanut butter. Ground or well-cooked tender beef, ham, veal, lamb, pork, or poultry.  Dairy Plain yogurt, kefir, and unsweetened liquid yogurt. Lactose-free milk, buttermilk, or soy milk. Plain hard cheese. Beverages Sport drinks. Clear broths. Diluted fruit juices (except prune). Regular, caffeine-free sodas such as ginger ale. Water. Decaffeinated teas. Oral rehydration solutions. Sugar-free beverages not sweetened with sugar alcohols. Other Bouillon, broth, or soups made from recommended foods.  The items listed above may not be a complete list of recommended foods or beverages. Contact your dietitian for more options. WHAT  FOODS ARE NOT RECOMMENDED? Grains Whole grain, whole wheat, bran, or rye breads, rolls, pastas, crackers, and cereals. Wild or brown  rice. Cereals that contain more than 2 g of fiber per serving. Corn tortillas or taco shells. Cooked or dry oatmeal. Granola. Popcorn. Vegetables Raw vegetables. Cabbage, broccoli, Brussels sprouts, artichokes, baked beans, beet greens, corn, kale, legumes, peas, sweet potatoes, and yams. Potato skins. Cooked spinach and cabbage. Fruits Dried fruit, including raisins and dates. Raw fruits. Stewed or dried prunes. Fresh apples with skin, apricots, mangoes, pears, raspberries, and strawberries.  Meat and Other Protein Products Chunky peanut butter. Nuts and seeds. Beans and lentils. Bacon.  Dairy High-fat cheeses. Milk, chocolate milk, and beverages made with milk, such as milk shakes. Cream. Ice cream. Sweets and Desserts Sweet rolls, doughnuts, and sweet breads. Pancakes and waffles. Fats and Oils Butter. Cream sauces. Margarine. Salad oils. Plain salad dressings. Olives. Avocados.  Beverages Caffeinated beverages (such as coffee, tea, soda, or energy drinks). Alcoholic beverages. Fruit juices with pulp. Prune juice. Soft drinks sweetened with high-fructose corn syrup or sugar alcohols. Other Coconut. Hot sauce. Chili powder. Mayonnaise. Gravy. Cream-based or milk-based soups.  The items listed above may not be a complete list of foods and beverages to avoid. Contact your dietitian for more information. WHAT SHOULD I DO IF I BECOME DEHYDRATED? Diarrhea can sometimes lead to dehydration. Signs of dehydration include dark urine and dry mouth and skin. If you think you are dehydrated, you should rehydrate with an oral rehydration solution. These solutions can be purchased at pharmacies, retail stores, or online.  Drink -1 cup (120-240 mL) of oral rehydration solution each time you have an episode of diarrhea. If drinking this amount makes your diarrhea worse, try drinking smaller amounts more often. For example, drink 1-3 tsp (5-15 mL) every 5-10 minutes.  A general rule for staying hydrated is to  drink 1-2 L of fluid per day. Talk to your health care provider about the specific amount you should be drinking each day. Drink enough fluids to keep your urine clear or pale yellow.   This information is not intended to replace advice given to you by your health care provider. Make sure you discuss any questions you have with your health care provider.   Document Released: 12/30/2003 Document Revised: 10/30/2014 Document Reviewed: 09/01/2013 Elsevier Interactive Patient Education 2016 Elsevier Inc.  

## 2016-02-12 LAB — COMPLETE METABOLIC PANEL WITH GFR
ALT: 11 U/L (ref 6–29)
AST: 14 U/L (ref 10–30)
Albumin: 4.1 g/dL (ref 3.6–5.1)
Alkaline Phosphatase: 54 U/L (ref 33–115)
BUN: 7 mg/dL (ref 7–25)
CO2: 26 mmol/L (ref 20–31)
Calcium: 9.2 mg/dL (ref 8.6–10.2)
Chloride: 100 mmol/L (ref 98–110)
Creat: 0.66 mg/dL (ref 0.50–1.10)
GFR, Est African American: >89 mL/min (ref >=60)
GFR, Est Non African American: >89 mL/min (ref >=60)
Glucose, Bld: 91 mg/dL (ref 65–99)
Potassium: 4.5 mmol/L (ref 3.5–5.3)
Sodium: 136 mmol/L (ref 135–146)
Total Bilirubin: 0.3 mg/dL (ref 0.2–1.2)
Total Protein: 7.6 g/dL (ref 6.1–8.1)

## 2016-02-15 ENCOUNTER — Telehealth: Payer: Self-pay | Admitting: *Deleted

## 2016-02-15 ENCOUNTER — Other Ambulatory Visit: Payer: Self-pay | Admitting: *Deleted

## 2016-02-15 DIAGNOSIS — A029 Salmonella infection, unspecified: Secondary | ICD-10-CM

## 2016-02-15 LAB — GASTROINTESTINAL PATHOGEN PANEL PCR
C. difficile Tox A/B, PCR: NOT DETECTED
Campylobacter, PCR: NOT DETECTED
Cryptosporidium, PCR: NOT DETECTED
E coli (ETEC) LT/ST PCR: NOT DETECTED
E coli (STEC) stx1/stx2, PCR: NOT DETECTED
E coli 0157, PCR: NOT DETECTED
Giardia lamblia, PCR: NOT DETECTED
Norovirus, PCR: NOT DETECTED
Rotavirus A, PCR: NOT DETECTED
Salmonella, PCR: DETECTED — CR
Shigella, PCR: NOT DETECTED

## 2016-02-15 NOTE — Telephone Encounter (Signed)
Order put in for blood test for salmonella, test number [id number].    They could not add on the stool culture, so we need to collect that as well.  I put in an order for that as well.  When patient is treated I will send to health department.

## 2016-02-16 ENCOUNTER — Other Ambulatory Visit: Payer: Self-pay | Admitting: Physician Assistant

## 2016-02-16 NOTE — Telephone Encounter (Signed)
Dr. Charna Elizabethuab [facility] who advised, given that the patient is improving, that no further follow up is necessary aside from contacting the health department. Will cancel future orders and ask that Connye Burkittlly make the health department aware. Ally please call patient and advise that no further follow up on our end is necessary.  If her symptoms return then she will need to RTC.  Please contact the health department. Deliah BostonMichael Bradie Sangiovanni, MS, PA-C 8:34 AM, 02/16/2016

## 2016-02-17 ENCOUNTER — Other Ambulatory Visit: Payer: Self-pay | Admitting: Physician Assistant

## 2016-02-17 ENCOUNTER — Other Ambulatory Visit: Payer: Self-pay | Admitting: *Deleted

## 2016-02-17 ENCOUNTER — Other Ambulatory Visit (INDEPENDENT_AMBULATORY_CARE_PROVIDER_SITE_OTHER): Payer: BC Managed Care – PPO

## 2016-02-17 ENCOUNTER — Telehealth: Payer: Self-pay | Admitting: *Deleted

## 2016-02-17 DIAGNOSIS — A029 Salmonella infection, unspecified: Secondary | ICD-10-CM

## 2016-02-17 DIAGNOSIS — A020 Salmonella enteritis: Secondary | ICD-10-CM

## 2016-02-17 MED ORDER — CIPROFLOXACIN HCL 500 MG PO TABS: 500.0000 mg | ORAL_TABLET | Freq: Two times a day (BID) | ORAL | Status: DC

## 2016-02-17 NOTE — Progress Notes (Signed)
Patient with a re-emergence of symptoms.  Had to excuse herself from a meeting at work yesterday to run to the bathroom.  Abdominal cramping has returned as well.  Duration of illness has been roughly ten days and she feels she is getting worse.  Will treat with Cipro 500 mg bid for 7 days, which literature suggest is the lowest effective dose and duration of treatment, and does not prolong the carrier state.  She will collect a stool culture before taking her first pill and return this Saturday.  Lab only orders in place.  Deliah BostonMichael Terianne Thaker, MS, PA-C 8:48 AM, 02/17/2016

## 2016-02-17 NOTE — Telephone Encounter (Signed)
Called left message on machine for patient to come in for lab only to get her blood drawn to see what type of Salmonella she has and to also do a stool culture for sensitivities.  We will start her on an antibiotic after she gives the stool sample.

## 2016-02-22 LAB — SALMONELLA, TOTAL ANTIBODY
Salmonella H, type a: POSITIVE — AB
Salmonella H, type b: POSITIVE — AB
Salmonella H, type d: POSITIVE — AB
Salmonella O, type D: POSITIVE — AB
Salmonella O, type Vi: POSITIVE — AB

## 2016-02-23 LAB — STOOL CULTURE

## 2016-06-23 ENCOUNTER — Ambulatory Visit (INDEPENDENT_AMBULATORY_CARE_PROVIDER_SITE_OTHER): Payer: BC Managed Care – PPO | Admitting: Physician Assistant

## 2016-06-23 VITALS — BP 110/70 | HR 84 | Temp 98.3°F | Resp 16 | Ht 65.5 in | Wt 162.6 lb

## 2016-06-23 DIAGNOSIS — R21 Rash and other nonspecific skin eruption: Secondary | ICD-10-CM

## 2016-06-23 LAB — POCT SKIN KOH: Skin KOH, POC: NEGATIVE

## 2016-06-23 MED ORDER — CLOTRIMAZOLE-BETAMETHASONE 1-0.05 % EX CREA: 1.0000 "application " | TOPICAL_CREAM | Freq: Two times a day (BID) | CUTANEOUS | 0 refills | Status: DC

## 2016-06-23 NOTE — Patient Instructions (Addendum)
Use the cream for at least 10 days before coming back for evaluation.     IF you received an x-ray today, you will receive an invoice from [facility]. Please contact [facility] at [phone] with questions or concerns regarding your invoice.   IF you received labwork today, you will receive an invoice from [facility]. Please contact [facility] at [phone] with questions or concerns regarding your invoice.   Our billing staff will not be able to assist you with questions regarding bills from these companies.  You will be contacted with the lab results as soon as they are available. The fastest way to get your results is to activate your My Chart account. Instructions are located on the last page of this paperwork. If you have not heard from us regarding the results in 2 weeks, please contact this office.

## 2016-06-23 NOTE — Progress Notes (Signed)
   06/23/2016 5:05 PM   DOB: June 07, 1986 / MRN: [id number]  SUBJECTIVE:  Gloria Christensen is a 30 y.o. female presenting for a rash on her medial right heal.  Reports this has been present for about two weeks now and it is worsening.  It does not itch or pain her.  Reports the rash does look worse after getting out of the shower.  She has tried topical steroids and this did not help.   She is allergic to ceclor [cefaclor] and sulfur.   She  has a past medical history of Allergy and Anemia.    She  reports that she has never smoked. She has never used smokeless tobacco. She  has no sexual activity history on file. The patient  has a past surgical history that includes LASIK (2011).  Her family history is not on file.  Review of Systems  Constitutional: Negative for chills and fever.  Musculoskeletal: Negative for myalgias.  Skin: Positive for rash. Negative for itching.    The problem list and medications were reviewed and updated by myself where necessary and exist elsewhere in the encounter.   OBJECTIVE:  BP 110/70 (BP Location: Right Arm, Patient Position: Sitting, Cuff Size: Normal)   Pulse 84   Temp 98.3 F (36.8 C) (Oral)   Resp 16   Ht 5' 5.5" (1.664 m)   Wt 162 lb 9.6 oz (73.8 kg)   LMP 06/10/2016 (Exact Date)   SpO2 98%   BMI 26.65 kg/m   Physical Exam  Constitutional: She is oriented to person, place, and time. She appears well-developed.  Cardiovascular: Normal rate and regular rhythm.   Pulmonary/Chest: Effort normal and breath sounds normal.  Musculoskeletal: Normal range of motion.       Feet:  Neurological: She is alert and oriented to person, place, and time. She has normal reflexes.  Skin: Skin is warm and dry. Rash (right medial heal rash.  Mild erythema.  No florescense on woods lamp. ) noted. She is not diaphoretic.    Wt Readings from Last 3 Encounters:  06/23/16 162 lb 9.6 oz (73.8 kg)  02/11/16 162 lb 6.4 oz (73.7 kg)  12/09/15 162 lb 6.4 oz (73.7 kg)      Results for orders placed or performed in visit on 06/23/16 (from the past 72 hour(s))  POCT Skin KOH     Status: None   Collection Time: 06/23/16  5:03 PM  Result Value Ref Range   Skin KOH, POC Negative     No results found.  ASSESSMENT AND PLAN  Gloria Christensen was seen today for foot injury.  Diagnoses and all orders for this visit:  Rash and nonspecific skin eruption: I don't know the etiology of this rash.  It has been present for two weeks and it dose not appear to be dangerous.  Will treat with Lotrisone for ten days and if the rash does not resolve will take a punch.  -     POCT Skin KOH -     Lotrisone    The patient is advised to call or return to clinic if she does not see an improvement in symptoms, or to seek the care of the closest emergency department if she worsens with the above plan.   Gloria BostonMichael Clark, MHS, PA-C [facility] 06/23/2016 5:05 PM

## 2019-04-25 ENCOUNTER — Encounter (HOSPITAL_COMMUNITY): Payer: Self-pay | Admitting: *Deleted

## 2019-04-25 ENCOUNTER — Emergency Department (HOSPITAL_COMMUNITY)
Admission: EM | Admit: 2019-04-25 | Discharge: 2019-04-25 | Disposition: A | Discharge status: Home / Self Care | Payer: BC Managed Care – PPO | Attending: Emergency Medicine | Admitting: Emergency Medicine

## 2019-04-25 ENCOUNTER — Other Ambulatory Visit: Payer: Self-pay

## 2019-04-25 ENCOUNTER — Emergency Department (HOSPITAL_COMMUNITY): Payer: BC Managed Care – PPO

## 2019-04-25 DIAGNOSIS — R112 Nausea with vomiting, unspecified: Secondary | ICD-10-CM

## 2019-04-25 DIAGNOSIS — Z79899 Other long term (current) drug therapy: Secondary | ICD-10-CM | POA: Diagnosis not present

## 2019-04-25 DIAGNOSIS — R1011 Right upper quadrant pain: Secondary | ICD-10-CM | POA: Diagnosis present

## 2019-04-25 LAB — COMPREHENSIVE METABOLIC PANEL
ALT: 14 U/L (ref 0–44)
AST: 16 U/L (ref 15–41)
Albumin: 3.6 g/dL (ref 3.5–5.0)
Alkaline Phosphatase: 53 U/L (ref 38–126)
Anion gap: 9 (ref 5–15)
BUN: 13 mg/dL (ref 6–20)
CO2: 26 mmol/L (ref 22–32)
Calcium: 8.6 mg/dL — ABNORMAL LOW (ref 8.9–10.3)
Chloride: 103 mmol/L (ref 98–111)
Creatinine, Ser: 0.73 mg/dL (ref 0.44–1.00)
GFR calc Af Amer: >60 mL/min (ref >60)
GFR calc non Af Amer: >60 mL/min (ref >60)
Glucose, Bld: 125 mg/dL — ABNORMAL HIGH (ref 70–99)
Potassium: 4.6 mmol/L (ref 3.5–5.1)
Sodium: 138 mmol/L (ref 135–145)
Total Bilirubin: 0.4 mg/dL (ref 0.3–1.2)
Total Protein: 6.8 g/dL (ref 6.5–8.1)

## 2019-04-25 LAB — CBC
HCT: 41.1 % (ref 36.0–46.0)
Hemoglobin: 13.3 g/dL (ref 12.0–15.0)
MCH: 29.1 pg (ref 26.0–34.0)
MCHC: 32.4 g/dL (ref 30.0–36.0)
MCV: 89.9 fL (ref 80.0–100.0)
Platelets: 197 10*3/uL (ref 150–400)
RBC: 4.57 MIL/uL (ref 3.87–5.11)
RDW: 11.9 % (ref 11.5–15.5)
WBC: 18.4 10*3/uL — ABNORMAL HIGH (ref 4.0–10.5)
nRBC: 0 % (ref 0.0–0.2)

## 2019-04-25 LAB — URINALYSIS, ROUTINE W REFLEX MICROSCOPIC
Bacteria, UA: NONE SEEN
Bilirubin Urine: NEGATIVE
Glucose, UA: NEGATIVE mg/dL
Hgb urine dipstick: NEGATIVE
Ketones, ur: NEGATIVE mg/dL
Leukocytes,Ua: NEGATIVE
Nitrite: NEGATIVE
Protein, ur: 30 mg/dL — AB
Specific Gravity, Urine: 1.029 (ref 1.005–1.030)
pH: 7 (ref 5.0–8.0)

## 2019-04-25 LAB — LIPASE, BLOOD: Lipase: 47 U/L (ref 11–51)

## 2019-04-25 LAB — I-STAT BETA HCG BLOOD, ED (MC, WL, AP ONLY): I-stat hCG, quantitative: <5 m[IU]/mL (ref <5)

## 2019-04-25 MED ORDER — IOHEXOL 300 MG/ML  SOLN
100.0000 mL | Freq: Once | INTRAMUSCULAR | Status: AC | PRN
  Administered 2019-04-25: 100 mL via INTRAVENOUS

## 2019-04-25 MED ORDER — SODIUM CHLORIDE 0.9% FLUSH: 3.0000 mL | Freq: Once | INTRAVENOUS | Status: DC

## 2019-04-25 NOTE — ED Notes (Signed)
ED Provider at bedside. 

## 2019-04-25 NOTE — ED Triage Notes (Signed)
Pt reports pain just above and to the right of the umbilicus. Nausea, vomited x 1. Denies fevers.

## 2019-04-25 NOTE — Discharge Instructions (Addendum)
°  SEEK IMMEDIATE MEDICAL ATTENTION IF: °The pain does not go away or becomes severe, particularly over the next 8-12 hours.  °A temperature above 100.4F develops.  °Repeated vomiting occurs (multiple episodes).  ° °Blood is being passed in stools or vomit (bright red or black tarry stools).  °Return also if you develop chest pain, difficulty breathing, dizziness or fainting, or become confused, poorly responsive, or inconsolable. ° °

## 2019-04-25 NOTE — ED Notes (Signed)
Patient transported to [facility] 

## 2019-04-25 NOTE — ED Provider Notes (Signed)
[facility]   CSN: [id number] Arrival date & time: 04/25/19  0319     History   Chief Complaint Chief Complaint  Patient presents with  . Abdominal Pain    HPI Gloria Christensen is a 33 y.o. female.     The history is provided by the patient.  Abdominal Pain Pain location:  RUQ Pain quality: sharp   Pain radiates to:  Does not radiate Pain severity:  Severe Onset quality:  Sudden Timing:  Constant Chronicity:  New Context: awakening from sleep   Relieved by:  Nothing Worsened by:  Movement and palpation Associated symptoms: vomiting   Associated symptoms: no chest pain, no diarrhea, no dysuria and no fever   Patient reports she had onset of right upper quadrant abdominal pain over the past 2 hours.  She reports it woke her up from sleep.  She reports associated nausea and vomiting.  She reports eating hotdogs for dinner.  She reports she usually has constipation, but did have a bowel movement yesterday that was nonbloody and no melena.  Past Medical History:  Diagnosis Date  . Allergy   . Anemia     Patient Active Problem List   Diagnosis Date Noted  . 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 Other malaise and fatigue 12/03/2012  . Irregular menstrual bleeding 12/03/2012    Past Surgical History:  Procedure Laterality Date  . LASIK  2011     OB History   No obstetric history on file.      Home Medications    Prior to Admission medications   Medication Sig Start Date End Date Taking? Authorizing Provider  cetirizine (ZYRTEC) 10 MG tablet Take 10 mg by mouth daily.    [provider]  clotrimazole-betamethasone (LOTRISONE) cream Apply 1 application topically 2 (two) times daily. 06/23/16   Ofilia Neaslark, Michael L, PA-C  ferrous gluconate (FERGON) 324 MG tablet Take 324 mg by mouth daily with breakfast.    [provider]  lamoTRIgine (LAMICTAL) 150 MG  tablet Take 150 mg by mouth daily.    [provider]  Multiple Vitamins-Minerals (MULTIVITAMIN WITH MINERALS) tablet Take 1 tablet by mouth daily.    [provider]  norgestimate-ethinyl estradiol (ORTHO-CYCLEN,SPRINTEC,PREVIFEM) 0.25-35 MG-MCG tablet Take 1 tablet by mouth daily.    [provider]    Family History No family history on file.  Social History Social History   Tobacco Use  . Smoking status: Never Smoker  . Smokeless tobacco: Never Used  Substance Use Topics  . Alcohol use: Never    Alcohol/week: 0.0 standard drinks    Frequency: Never  . Drug use: Never     Allergies   Ceclor [cefaclor] and Sulfur   Review of Systems Review of Systems  Constitutional: Negative for fever.  Cardiovascular: Negative for chest pain.  Gastrointestinal: Positive for abdominal pain and vomiting. Negative for diarrhea.  Genitourinary: Negative for dysuria.  All other systems reviewed and are negative.    Physical Exam Updated Vital Signs BP 128/86 (BP Location: Right Arm)   Pulse (!) 103   Temp 98.1 F (36.7 C) (Oral)   Resp 16   LMP 04/20/2019   SpO2 100%   Physical Exam  CONSTITUTIONAL: Well developed/well nourished uncomfortable appearing HEAD: Normocephalic/atraumatic EYES: EOMI/PERRL, no icterus ENMT: Mucous membranes moist NECK: supple no meningeal signs SPINE/BACK:entire spine nontender CV: S1/S2 noted, no murmurs/rubs/gallops noted LUNGS: Lungs are clear to auscultation bilaterally,  no apparent distress ABDOMEN: soft, moderate RUQ tenderness, no rebound or guarding, bowel sounds noted throughout abdomen GU:no cva tenderness NEURO: Pt is awake/alert/appropriate, moves all extremitiesx4.  No facial droop.   EXTREMITIES: pulses normal/equal, full ROM SKIN: warm, color normal PSYCH: no abnormalities of mood noted, alert and oriented to situation  ED Treatments / Results  Labs (all labs ordered are listed, but only abnormal  results are displayed) Labs Reviewed  COMPREHENSIVE METABOLIC PANEL - Abnormal; Notable for the following components:      Result Value   Glucose, Bld 125 (*)    Calcium 8.6 (*)    All other components within normal limits  CBC - Abnormal; Notable for the following components:   WBC 18.4 (*)    All other components within normal limits  URINALYSIS, ROUTINE W REFLEX MICROSCOPIC - Abnormal; Notable for the following components:   APPearance HAZY (*)    Protein, ur 30 (*)    All other components within normal limits  LIPASE, BLOOD  I-STAT BETA HCG BLOOD, ED ([facility], [facility], [facility] ONLY)    EKG None  Radiology US Abdomen Limited Ruq  Result Date: 04/25/2019 CLINICAL DATA:  33 y/o  F; right upper quadrant abdominal pain. EXAM: ULTRASOUND ABDOMEN LIMITED RIGHT UPPER QUADRANT COMPARISON:  None. FINDINGS: Gallbladder: No gallstones or wall thickening visualized. No sonographic Murphy sign noted by sonographer. Common bile duct: Diameter: 4.5 mm Liver: No focal lesion identified. Within normal limits in parenchymal echogenicity. Portal vein is patent on color Doppler imaging with normal direction of blood flow towards the liver. IMPRESSION: Normal right upper quadrant ultrasound. Electronically Signed   By: Kristine Garbe M.D.   On: 04/25/2019 04:59    Procedures Procedures (including critical care time)  Medications Ordered in ED Medications  sodium chloride flush (NS) 0.9 % injection 3 mL (has no administration in time range)     Initial Impression / Assessment and Plan / ED Course  I have reviewed the triage vital signs and the nursing notes.  Pertinent labs & imaging results that were available during my care of the patient were reviewed by me and considered in my medical decision making (see chart for details).        4:06 AM Will obtain right upper quadrant ultrasound.  Patient declines pain medicine at this time 5:34 AM Ultrasound imaging is negative at this time.  However  patient is still having pain.  She has pain in her right upper quadrant but now is having some pain inferior to this region.  She also has elevated white count.  At this point cannot rule out acute appendicitis.  Discussed proceeding with CT imaging. Patient agreeable with plan 6:29 AM CT imaging negative Pt stable at this time Will d/c home  Final Clinical Impressions(s) / ED Diagnoses   Final diagnoses:  RUQ pain  Right upper quadrant abdominal pain  Non-intractable vomiting with nausea, unspecified vomiting type    ED Discharge Orders    None       Ripley Fraise, MD 04/25/19 [phone]

## 2019-04-25 NOTE — ED Notes (Signed)
Patient transported to CT 

## 2019-11-27 ENCOUNTER — Emergency Department (HOSPITAL_COMMUNITY): Payer: BC Managed Care – PPO

## 2019-11-27 ENCOUNTER — Emergency Department (HOSPITAL_COMMUNITY)
Admission: EM | Admit: 2019-11-27 | Discharge: 2019-11-28 | Disposition: A | Discharge status: Home / Self Care | Payer: BC Managed Care – PPO | Attending: Emergency Medicine | Admitting: Emergency Medicine

## 2019-11-27 ENCOUNTER — Other Ambulatory Visit: Payer: Self-pay

## 2019-11-27 ENCOUNTER — Encounter (HOSPITAL_COMMUNITY): Payer: Self-pay | Admitting: Emergency Medicine

## 2019-11-27 DIAGNOSIS — Z79899 Other long term (current) drug therapy: Secondary | ICD-10-CM | POA: Insufficient documentation

## 2019-11-27 DIAGNOSIS — K625 Hemorrhage of anus and rectum: Secondary | ICD-10-CM | POA: Diagnosis not present

## 2019-11-27 DIAGNOSIS — R109 Unspecified abdominal pain: Secondary | ICD-10-CM | POA: Diagnosis present

## 2019-11-27 DIAGNOSIS — N200 Calculus of kidney: Secondary | ICD-10-CM | POA: Insufficient documentation

## 2019-11-27 DIAGNOSIS — N12 Tubulo-interstitial nephritis, not specified as acute or chronic: Secondary | ICD-10-CM

## 2019-11-27 LAB — COMPREHENSIVE METABOLIC PANEL
ALT: 13 U/L (ref 0–44)
AST: 15 U/L (ref 15–41)
Albumin: 3.6 g/dL (ref 3.5–5.0)
Alkaline Phosphatase: 57 U/L (ref 38–126)
Anion gap: 12 (ref 5–15)
BUN: 8 mg/dL (ref 6–20)
CO2: 22 mmol/L (ref 22–32)
Calcium: 8.9 mg/dL (ref 8.9–10.3)
Chloride: 101 mmol/L (ref 98–111)
Creatinine, Ser: 0.69 mg/dL (ref 0.44–1.00)
GFR calc Af Amer: >60 mL/min (ref >60)
GFR calc non Af Amer: >60 mL/min (ref >60)
Glucose, Bld: 110 mg/dL — ABNORMAL HIGH (ref 70–99)
Potassium: 3.6 mmol/L (ref 3.5–5.1)
Sodium: 135 mmol/L (ref 135–145)
Total Bilirubin: 1.1 mg/dL (ref 0.3–1.2)
Total Protein: 7.5 g/dL (ref 6.5–8.1)

## 2019-11-27 LAB — CBC WITH DIFFERENTIAL/PLATELET
Abs Immature Granulocytes: 0.07 10*3/uL (ref 0.00–0.07)
Basophils Absolute: 0 10*3/uL (ref 0.0–0.1)
Basophils Relative: 0 %
Eosinophils Absolute: 0 10*3/uL (ref 0.0–0.5)
Eosinophils Relative: 0 %
HCT: 36.9 % (ref 36.0–46.0)
Hemoglobin: 12.4 g/dL (ref 12.0–15.0)
Immature Granulocytes: 0 %
Lymphocytes Relative: 3 %
Lymphs Abs: 0.6 10*3/uL — ABNORMAL LOW (ref 0.7–4.0)
MCH: 29.4 pg (ref 26.0–34.0)
MCHC: 33.6 g/dL (ref 30.0–36.0)
MCV: 87.4 fL (ref 80.0–100.0)
Monocytes Absolute: 1.2 10*3/uL — ABNORMAL HIGH (ref 0.1–1.0)
Monocytes Relative: 6 %
Neutro Abs: 16.4 10*3/uL — ABNORMAL HIGH (ref 1.7–7.7)
Neutrophils Relative %: 91 %
Platelets: 394 10*3/uL (ref 150–400)
RBC: 4.22 MIL/uL (ref 3.87–5.11)
RDW: 12 % (ref 11.5–15.5)
WBC: 18.2 10*3/uL — ABNORMAL HIGH (ref 4.0–10.5)
nRBC: 0 % (ref 0.0–0.2)

## 2019-11-27 LAB — I-STAT BETA HCG BLOOD, ED (MC, WL, AP ONLY): I-stat hCG, quantitative: <5 m[IU]/mL (ref <5)

## 2019-11-27 LAB — URINALYSIS, ROUTINE W REFLEX MICROSCOPIC
Bilirubin Urine: NEGATIVE
Glucose, UA: NEGATIVE mg/dL
Ketones, ur: 20 mg/dL — AB
Nitrite: NEGATIVE
Protein, ur: 100 mg/dL — AB
RBC / HPF: >50 RBC/hpf — ABNORMAL HIGH (ref 0–5)
Specific Gravity, Urine: 1.024 (ref 1.005–1.030)
WBC, UA: >50 WBC/hpf — ABNORMAL HIGH (ref 0–5)
pH: 6 (ref 5.0–8.0)

## 2019-11-27 LAB — TYPE AND SCREEN
ABO/RH(D): A POS
Antibody Screen: NEGATIVE

## 2019-11-27 LAB — ABO/RH: ABO/RH(D): A POS

## 2019-11-27 LAB — POC OCCULT BLOOD, ED: Fecal Occult Bld: POSITIVE — AB

## 2019-11-27 MED ORDER — ONDANSETRON 4 MG PO TBDP: 4.0000 mg | ORAL_TABLET | Freq: Three times a day (TID) | ORAL | 0 refills | Status: DC | PRN

## 2019-11-27 MED ORDER — MORPHINE SULFATE (PF) 4 MG/ML IV SOLN
4.0000 mg | Freq: Once | INTRAVENOUS | Status: AC
  Administered 2019-11-27: 4 mg via INTRAVENOUS
  Filled 2019-11-27: qty 1

## 2019-11-27 MED ORDER — CIPROFLOXACIN HCL 500 MG PO TABS: 500.0000 mg | ORAL_TABLET | Freq: Two times a day (BID) | ORAL | 0 refills | Status: DC

## 2019-11-27 MED ORDER — HYDROCODONE-ACETAMINOPHEN 5-325 MG PO TABS: 1.0000 | ORAL_TABLET | Freq: Four times a day (QID) | ORAL | 0 refills | Status: DC | PRN

## 2019-11-27 MED ORDER — CIPROFLOXACIN HCL 500 MG PO TABS: 500.0000 mg | ORAL_TABLET | Freq: Two times a day (BID) | ORAL | 0 refills | Status: AC

## 2019-11-27 MED ORDER — IOHEXOL 300 MG/ML  SOLN
100.0000 mL | Freq: Once | INTRAMUSCULAR | Status: AC | PRN
  Administered 2019-11-27: 100 mL via INTRAVENOUS

## 2019-11-27 MED ORDER — CIPROFLOXACIN IN D5W 400 MG/200ML IV SOLN
400.0000 mg | Freq: Once | INTRAVENOUS | Status: AC
  Administered 2019-11-27: 400 mg via INTRAVENOUS
  Filled 2019-11-27: qty 200

## 2019-11-27 MED ORDER — SODIUM CHLORIDE 0.9 % IV BOLUS
1000.0000 mL | Freq: Once | INTRAVENOUS | Status: AC
  Administered 2019-11-27: 23:00:00 1000 mL via INTRAVENOUS

## 2019-11-27 NOTE — Discharge Instructions (Addendum)
Take the ciprofloxacin for the entire course.  Failure to take the entire course of this medication can result in worsening or recurrence of your infection. We will contact you with the results of your urine culture when it is available. We will let you know if you need to be on a different antibiotic based on the culture, we will let you know. Take Zofran as needed for nausea. Increase your oral hydration. Return to the ED if you start to have worsening pain, inability to tolerate anything by mouth, increase in bleeding, lightheadedness, or shortness of breath.

## 2019-11-27 NOTE — ED Provider Notes (Signed)
[facility]-EMERGENCY DEPT Provider Note   CSN: [id number] Arrival date & time: 11/27/19  1330     History Chief Complaint  Patient presents with  . Abdominal Pain  . Back Pain    Gloria Christensen is a 34 y.o. female with a past medical history of anemia presenting to the ED with a chief complaint of abdominal pain and black stools.  Starting on 11/17/2019, was concerned that she was constipated as she had decreased bowel movements.  She took several doses of laxatives including stimulant laxatives and MiraLAX, 8 by fiber foods.  States that she did have successful bowel movements.  She presented to a walk-in urgent care clinic on 11/24/2019 and was told to stop taking her laxatives, eat a bland diet and get rest.  She was told to present to the ED if her stools turned black or she lost her appetite.  She states that both of these things happen since yesterday.  She had 2 episodes of black stools yesterday and 1 today.  She denies history of the symptoms in the past.  She was diagnosed with H. pylori about 1 year ago and was treated for the same.  She denies anticoagulant use, new medication changes, pain or blood with wiping the area, rectal pain, vomiting but does endorse nausea.  Reports abdominal pain throughout the middle of her abdomen without specific aggravating or alleviating factor.  She does have some dysuria as well.  Denies any fever but does endorse chills while waiting in the waiting room.  Denies prior abdominal surgeries, chronic NSAID use, possibility of pregnancy, vaginal complaints.  HPI     Past Medical History:  Diagnosis Date  . Allergy   . Anemia     Patient Active Problem List   Diagnosis Date Noted  . 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 Other malaise and fatigue 12/03/2012  . Irregular menstrual bleeding 12/03/2012    Past Surgical History:  Procedure Laterality Date  . LASIK  2011     OB  History   No obstetric history on file.     No family history on file.  Social History   Tobacco Use  . Smoking status: Never Smoker  . Smokeless tobacco: Never Used  Substance Use Topics  . Alcohol use: Never    Alcohol/week: 0.0 standard drinks  . Drug use: Never    Home Medications Prior to Admission medications   Medication Sig Start Date End Date Taking? Authorizing Provider  cetirizine (ZYRTEC) 10 MG tablet Take 10 mg by mouth daily.   Yes [provider]  ferrous gluconate (FERGON) 324 MG tablet Take 324 mg by mouth daily with breakfast.   Yes [provider]  Lactobacillus-Inulin (CULTURELLE DIGESTIVE DAILY PO) Take 1 capsule by mouth daily.   Yes [provider]  lamoTRIgine (LAMICTAL) 100 MG tablet Take 100 mg by mouth every evening.  11/14/19  Yes [provider]  norgestimate-ethinyl estradiol (ORTHO-CYCLEN,SPRINTEC,PREVIFEM) 0.25-35 MG-MCG tablet Take 1 tablet by mouth daily.   Yes [provider]  ciprofloxacin (CIPRO) 500 MG tablet Take 1 tablet (500 mg total) by mouth every 12 (twelve) hours for 7 days. 11/27/19 12/04/19  Cheryllynn Sarff, PA-C  clotrimazole-betamethasone (LOTRISONE) cream Apply 1 application topically 2 (two) times daily. Patient not taking: Reported on 04/25/2019 06/23/16   Ofilia Neas, PA-C  ondansetron (ZOFRAN ODT) 4 MG disintegrating tablet Take 1 tablet (4 mg total) by mouth every 8 (eight)  hours as needed for nausea or vomiting. 11/27/19   Breannah Kratt, PA-C    Allergies    Ceclor [cefaclor], Ferrous sulfate, and Sulfur  Review of Systems   Review of Systems  Constitutional: Negative for appetite change, chills and fever.  HENT: Negative for ear pain, rhinorrhea, sneezing and sore throat.   Eyes: Negative for photophobia and visual disturbance.  Respiratory: Negative for cough, chest tightness, shortness of breath and wheezing.   Cardiovascular: Negative for chest pain and palpitations.    Gastrointestinal: Positive for abdominal pain, blood in stool and nausea. Negative for constipation, diarrhea and vomiting.  Genitourinary: Negative for dysuria, hematuria and urgency.  Musculoskeletal: Negative for myalgias.  Skin: Negative for rash.  Neurological: Negative for dizziness, weakness and light-headedness.    Physical Exam Updated Vital Signs BP 125/72   Pulse 96   Temp 99 F (37.2 C) (Oral)   Resp 19   LMP 11/04/2019   SpO2 100%   Physical Exam Vitals and nursing note reviewed. Exam conducted with a chaperone present.  Constitutional:      General: She is not in acute distress.    Appearance: She is well-developed.  HENT:     Head: Normocephalic and atraumatic.     Nose: Nose normal.  Eyes:     General: No scleral icterus.       Left eye: No discharge.     Conjunctiva/sclera: Conjunctivae normal.  Cardiovascular:     Rate and Rhythm: Normal rate and regular rhythm.     Heart sounds: Normal heart sounds. No murmur. No friction rub. No gallop.   Pulmonary:     Effort: Pulmonary effort is normal. No respiratory distress.     Breath sounds: Normal breath sounds.  Abdominal:     General: Bowel sounds are normal. There is no distension.     Palpations: Abdomen is soft.     Tenderness: There is abdominal tenderness in the periumbilical area. There is no guarding.  Genitourinary:    Rectum: No tenderness.     Comments: Rectal exam with brown stool noted. No tenderness. Musculoskeletal:        General: Normal range of motion.     Cervical back: Normal range of motion and neck supple.  Skin:    General: Skin is warm and dry.     Findings: No rash.  Neurological:     Mental Status: She is alert.     Motor: No abnormal muscle tone.     Coordination: Coordination normal.     ED Results / Procedures / Treatments   Labs (all labs ordered are listed, but only abnormal results are displayed) Labs Reviewed  COMPREHENSIVE METABOLIC PANEL - Abnormal; Notable  for the following components:      Result Value   Glucose, Bld 110 (*)    All other components within normal limits  CBC WITH DIFFERENTIAL/PLATELET - Abnormal; Notable for the following components:   WBC 18.2 (*)    Neutro Abs 16.4 (*)    Lymphs Abs 0.6 (*)    Monocytes Absolute 1.2 (*)    All other components within normal limits  URINALYSIS, ROUTINE W REFLEX MICROSCOPIC - Abnormal; Notable for the following components:   APPearance TURBID (*)    Hgb urine dipstick MODERATE (*)    Ketones, ur 20 (*)    Protein, ur 100 (*)    Leukocytes,Ua LARGE (*)    RBC / HPF >50 (*)    WBC, UA >50 (*)  Bacteria, UA RARE (*)    All other components within normal limits  POC OCCULT BLOOD, ED - Abnormal; Notable for the following components:   Fecal Occult Bld POSITIVE (*)    All other components within normal limits  URINE CULTURE  I-STAT BETA HCG BLOOD, ED ([facility], [facility], [facility] ONLY)  TYPE AND SCREEN  ABO/RH    EKG None  Radiology CT ABDOMEN PELVIS W CONTRAST  Result Date: 11/27/2019 CLINICAL DATA:  34 year old female with abdominal pain and dark stools. EXAM: CT ABDOMEN AND PELVIS WITH CONTRAST TECHNIQUE: Multidetector CT imaging of the abdomen and pelvis was performed using the standard protocol following bolus administration of intravenous contrast. CONTRAST:  OMNIPAQUE IOHEXOL 300 MG/ML  SOLN COMPARISON:  CT abdomen pelvis dated 04/25/2019. FINDINGS: Lower chest: The visualized lung bases are clear. No intra-abdominal free air or free fluid. Hepatobiliary: No focal liver abnormality is seen. No gallstones, gallbladder wall thickening, or biliary dilatation. Pancreas: Unremarkable. No pancreatic ductal dilatation or surrounding inflammatory changes. Spleen: Normal in size without focal abnormality. Adrenals/Urinary Tract: The adrenal glands are unremarkable. There is no hydronephrosis on either side. There is urothelial enhancement of the collecting systems and ureters, right greater left most  consistent with UTI and possible early pyelonephritis. Correlation with urinalysis recommended. No drainable fluid collection or abscess. The urinary bladder is unremarkable. Stomach/Bowel: There is no bowel obstruction or active inflammation. The appendix is normal. Vascular/Lymphatic: The abdominal aorta and IVC are unremarkable. No portal venous gas. There is no adenopathy. Reproductive: The uterus is grossly unremarkable. No adnexal masses. Other: Small fat containing umbilical hernia. Musculoskeletal: No acute or significant osseous findings. IMPRESSION: 1. Findings most consistent with UTI and probable early pyelonephritis. No abscess. 2. No bowel obstruction or active inflammation.  Normal appendix. Electronically Signed   By: Elgie Collard M.D.   On: 11/27/2019 21:19    Procedures Procedures (including critical care time)  Medications Ordered in ED Medications  sodium chloride 0.9 % bolus 1,000 mL (has no administration in time range)  ciprofloxacin (CIPRO) IVPB 400 mg (has no administration in time range)  iohexol (OMNIPAQUE) 300 MG/ML solution 100 mL (100 mLs Intravenous Contrast Given 11/27/19 2057)  morphine 4 MG/ML injection 4 mg (4 mg Intravenous Given 11/27/19 2309)    ED Course  I have reviewed the triage vital signs and the nursing notes.  Pertinent labs & imaging results that were available during my care of the patient were reviewed by me and considered in my medical decision making (see chart for details).    MDM Rules/Calculators/A&P                      34 year old female with a past medical history of anemia presenting to the ED with a chief complaint of abdominal pain and black stools.  Essentially her symptoms began on 11/17/2019 and she was concerned that she was constipated.  She took several doses of stimulant laxatives, MiraLAX and eat high-fiber foods.  She had successful bowel movements but then 3 days ago presented to urgent care and was told to stop her laxative  use.  Yesterday she had 2 episodes of black stools and today she had one more.  She also has a decreased appetite.  She was told to follow-up in the ER.  She reports abdominal pain that is throughout the middle of her abdomen.  Denies any diarrhea or rectal pain.  She denies any anticoagulant use.  She was diagnosed with H. pylori last  year and was treated for the same.  She does note some dysuria and chills.  On exam abdomen is tender in the middle area without rebound or guarding.  Rectal exam without tenderness, hemorrhoids, fissures and brown appearing stool noted.  Work-up here shows Hemoccult positive stool, normal hemoglobin level, leukocytosis of 18.  Pregnancy test is negative.  CMP is unremarkable.  Urinalysis shows large leukocytes, pyuria and bacteria.  CT scan shows UTI with developing pyelonephritis but no other abnormalities.  Patient remains hemodynamically stable.  We will treat her with Cipro for pyelonephritis and have her follow-up with GI regarding her rectal bleeding.  I feel that this could be due to her laxative use recently.  She is comfortable with discharge home with GI and PCP follow-up.  Patient is hemodynamically stable, in NAD, and able to ambulate in the ED. Evaluation does not show pathology that would require ongoing emergent intervention or inpatient treatment. I explained the diagnosis to the patient. Pain has been managed and has no complaints prior to discharge. Patient is comfortable with above plan and is stable for discharge at this time. All questions were answered prior to disposition. Strict return precautions for returning to the ED were discussed. Encouraged follow up with PCP.   An After Visit Summary was printed and given to the patient.   Portions of this note were generated with Scientist, clinical (histocompatibility and immunogenetics). Dictation errors may occur despite best attempts at proofreading.  Final Clinical Impression(s) / ED Diagnoses Final diagnoses:  Pyelonephritis  Rectal  bleeding    Rx / DC Orders ED Discharge Orders         Ordered    ciprofloxacin (CIPRO) 500 MG tablet  Every 12 hours     11/27/19 2304    ondansetron (ZOFRAN ODT) 4 MG disintegrating tablet  Every 8 hours PRN     11/27/19 2304           Dietrich Pates, PA-C 11/27/19 2313    Charlynne Pander, MD 11/28/19 1504

## 2019-11-27 NOTE — ED Notes (Signed)
Pt transported to CT ?

## 2019-11-27 NOTE — ED Triage Notes (Signed)
Pt having abd pains that radiates to her back for week. Reports saw her PCP Monday for same complaints and constipation and was told to stop taking the medications was taking last week for the constipation. Reports not eating as much and stools are black.

## 2019-11-28 ENCOUNTER — Encounter: Payer: Self-pay | Admitting: Physician Assistant

## 2019-11-29 LAB — URINE CULTURE: Culture: >=100000 — AB

## 2019-12-01 ENCOUNTER — Telehealth: Payer: Self-pay | Admitting: Emergency Medicine

## 2019-12-01 NOTE — Telephone Encounter (Signed)
Post ED Visit - Positive Culture Follow-up  Culture report reviewed by antimicrobial stewardship pharmacist: [facility] []  , Pharm.D. []  Enzo Bi, Pharm.D., BCPS AQ-ID []  , Pharm.D., BCPS []  Celedonio Miyamoto, Pharm.D., BCPS []  [place], Garvin Fila.D., BCPS, AAHIVP []  , Pharm.D., BCPS, AAHIVP []  Georgina Pillion, PharmD, BCPS []  , PharmD, BCPS []  [place], PharmD, BCPS []  [place], PharmD []  , PharmD, BCPS []  Estella Husk, PharmD  Pharmacy Team []  Lysle Pearl, PharmD []  , PharmD []  Phillips Climes, PharmD []  , Rph []  Agapito Games) , PharmD []  Verlan Friends, PharmD []  , PharmD [x]  Mervyn Gay, PharmD []  , PharmD []  Vinnie Level, PharmD []  [facility], PharmD []  , PharmD []  Len Childs, PharmD   Positive urine culture Treated with ciprofloxacin, organism sensitive to the same and no further patient follow-up is required at this time.  12/01/2019, 9:28 AM

## 2019-12-01 NOTE — Telephone Encounter (Signed)
Post ED Visit - Positive Culture Follow-up  Culture report reviewed by antimicrobial stewardship pharmacist: [facility] []  , Pharm.D. []  Enzo Bi, Pharm.D., BCPS AQ-ID []  , Pharm.D., BCPS []  Celedonio Miyamoto, .D., BCPS []  [place], .D., BCPS, AAHIVP []  Georgina Pillion, Pharm.D., BCPS, AAHIVP []  [address], PharmD, BCPS []  , PharmD, BCPS []  [place], PharmD, BCPS []  [address], PharmD []  , PharmD, BCPS []  Estella Husk, PharmD  Pharmacy Team []  Lysle Pearl, PharmD []  , PharmD []  Phillips Climes, PharmD []  , Rph []  [employer]) , PharmD []  Verlan Friends, PharmD []  , PharmD [x]  Mervyn Gay, PharmD []  , PharmD []  Vinnie Level, PharmD []  [facility], PharmD []  , PharmD []  Len Childs, PharmD   Positive urine culture Treated with ciprofloxacin, organism sensitive to the same and no further patient follow-up is required at this time.  12/01/2019, 9:24 AM

## 2019-12-04 ENCOUNTER — Ambulatory Visit: Payer: BC Managed Care – PPO | Admitting: Physician Assistant

## 2019-12-04 ENCOUNTER — Encounter: Payer: Self-pay | Admitting: Physician Assistant

## 2019-12-04 ENCOUNTER — Other Ambulatory Visit (INDEPENDENT_AMBULATORY_CARE_PROVIDER_SITE_OTHER): Payer: BC Managed Care – PPO

## 2019-12-04 VITALS — BP 112/66 | HR 64 | Temp 97.9°F | Ht 66.0 in | Wt 151.4 lb

## 2019-12-04 DIAGNOSIS — R195 Other fecal abnormalities: Secondary | ICD-10-CM

## 2019-12-04 DIAGNOSIS — N10 Acute tubulo-interstitial nephritis: Secondary | ICD-10-CM

## 2019-12-04 DIAGNOSIS — N12 Tubulo-interstitial nephritis, not specified as acute or chronic: Secondary | ICD-10-CM

## 2019-12-04 DIAGNOSIS — K5900 Constipation, unspecified: Secondary | ICD-10-CM | POA: Diagnosis not present

## 2019-12-04 HISTORY — DX: Acute pyelonephritis: N10

## 2019-12-04 LAB — CBC WITH DIFFERENTIAL/PLATELET
Basophils Absolute: 0 10*3/uL (ref 0.0–0.1)
Basophils Relative: 0.8 % (ref 0.0–3.0)
Eosinophils Absolute: 0.1 10*3/uL (ref 0.0–0.7)
Eosinophils Relative: 2.2 % (ref 0.0–5.0)
HCT: 36.5 % (ref 36.0–46.0)
Hemoglobin: 12.1 g/dL (ref 12.0–15.0)
Lymphocytes Relative: 38.7 % (ref 12.0–46.0)
Lymphs Abs: 2.2 10*3/uL (ref 0.7–4.0)
MCHC: 33.2 g/dL (ref 30.0–36.0)
MCV: 86.8 fl (ref 78.0–100.0)
Monocytes Absolute: 0.3 10*3/uL (ref 0.1–1.0)
Monocytes Relative: 5.6 % (ref 3.0–12.0)
Neutro Abs: 2.9 10*3/uL (ref 1.4–7.7)
Neutrophils Relative %: 52.7 % (ref 43.0–77.0)
Platelets: 551 10*3/uL — ABNORMAL HIGH (ref 150.0–400.0)
RBC: 4.21 Mil/uL (ref 3.87–5.11)
RDW: 12.5 % (ref 11.5–15.5)
WBC: 5.6 10*3/uL (ref 4.0–10.5)

## 2019-12-04 LAB — IBC + FERRITIN
Ferritin: 34.1 ng/mL (ref 10.0–291.0)
Iron: 37 ug/dL — ABNORMAL LOW (ref 42–145)
Saturation Ratios: 9 % — ABNORMAL LOW (ref 20.0–50.0)
Transferrin: 294 mg/dL (ref 212.0–360.0)

## 2019-12-04 NOTE — Progress Notes (Signed)
Subjective:    Patient ID: Gloria Christensen, female    DOB: 06/08/1986, 34 y.o.   MRN: [id number]  HPI Gloria Christensen is a pleasant 34 year old white female, new to GI today referred after recent ER visit on 11/27/2019 with complaints of abdominal pain. She has not had any prior GI evaluation.  She does have history of bipolar disorder, prior eating disorder per her report, previous treatment for H. pylori and episode of Salmonella few years ago. Around January 25 she apparently had been feeling significantly constipated which is quite unusual for her She was feeling uncomfortable in her lower abdomen.  She wound up taking multiple different laxatives and eventually was able to purge her bowel.  She went to an urgent care on 11/24/2019 with the lower abdominal discomfort and was told to stop taking laxatives and rest her bowel.  By 11/27/2019 she was having significant lower abdominal pain and pain around into the right back.  She says she was hurting so bad she could not walk upright.  She had noticed some dysuria.  She had not had any fever or chills.  Appetite was decreased.  She had taken her iron supplement and had noticed a dark stool on Wednesday and a dark stool on Thursday 11/27/2019. Work-up in the emergency room revealed WBC of 18.2, hemoglobin 12.4 hematocrit of 36, MCV of 87, UA was abnormal, chemistries unremarkable. CT of the abdomen and pelvis with contrast showed urothelial enhancement of the collecting systems and ureters right greater than left most consistent with UTI and possible early pyelonephritis, urinary bladder unremarkable, small fat-containing umbilical hernia no bowel obstruction or active inflammation and normal appendix. Stool was Hemoccult it and was positive.  She was discharged home on antibiotics/Cipro which she is completing today  She says she is feeling significantly better but still having some mild lower abdominal discomfort.  She is able to eat without difficulty.  Bowel  movements have been normal over the past several days and she has not noticed any black stools .  She thinks she may have seen some pinkish blood on the tissue today.  No regular aspirin or NSAIDs.  Review of Systems Pertinent positive and negative review of systems were noted in the above HPI section.  All other review of systems was otherwise negative.  Outpatient Encounter Medications as of 12/04/2019  Medication Sig  . cetirizine (ZYRTEC) 10 MG tablet Take 10 mg by mouth daily.  . ciprofloxacin (CIPRO) 500 MG tablet Take 1 tablet (500 mg total) by mouth every 12 (twelve) hours for 7 days.  . clotrimazole-betamethasone (LOTRISONE) cream Apply 1 application topically 2 (two) times daily.  Marland Kitchen lamoTRIgine (LAMICTAL) 100 MG tablet Take 100 mg by mouth every evening.   . norgestimate-ethinyl estradiol (ORTHO-CYCLEN,SPRINTEC,PREVIFEM) 0.25-35 MG-MCG tablet Take 1 tablet by mouth daily.  . ferrous gluconate (FERGON) 324 MG tablet Take 324 mg by mouth daily with breakfast.  . HYDROcodone-acetaminophen (NORCO/VICODIN) 5-325 MG tablet Take 1 tablet by mouth every 6 (six) hours as needed. (Patient not taking: Reported on 12/04/2019)  . Lactobacillus-Inulin (CULTURELLE DIGESTIVE DAILY PO) Take 1 capsule by mouth daily.  . ondansetron (ZOFRAN ODT) 4 MG disintegrating tablet Take 1 tablet (4 mg total) by mouth every 8 (eight) hours as needed for nausea or vomiting. (Patient not taking: Reported on 12/04/2019)   No facility-administered encounter medications on file as of 12/04/2019.   Allergies  Allergen Reactions  . Ceclor [Cefaclor] Hives  . Ferrous Sulfate Nausea And Vomiting  . Sulfur Hives  Patient Active Problem List   Diagnosis Date Noted  . Acute pyelonephritis 12/04/2019  . 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 Other malaise and fatigue 12/03/2012  . Irregular menstrual bleeding 12/03/2012   Social History   Socioeconomic History  . Marital  status: Single    Spouse name: Not on file  . Number of children: Not on file  . Years of education: Not on file  . Highest education level: Not on file  Occupational History  . Not on file  Tobacco Use  . Smoking status: Never Smoker  . Smokeless tobacco: Never Used  Substance and Sexual Activity  . Alcohol use: Never    Alcohol/week: 0.0 standard drinks  . Drug use: Never  . Sexual activity: Never  Other Topics Concern  . Not on file  Social History Narrative  . Not on file   Social Determinants of Health   Financial Resource Strain:   . Difficulty of Paying Living Expenses: Not on file  Food Insecurity:   . Worried About Charity fundraiser in the Last Year: Not on file  . Ran Out of Food in the Last Year: Not on file  Transportation Needs:   . Lack of Transportation (Medical): Not on file  . Lack of Transportation (Non-Medical): Not on file  Physical Activity:   . Days of Exercise per Week: Not on file  . Minutes of Exercise per Session: Not on file  Stress:   . Feeling of Stress : Not on file  Social Connections:   . Frequency of Communication with Friends and Family: Not on file  . Frequency of Social Gatherings with Friends and Family: Not on file  . Attends Religious Services: Not on file  . Active Member of Clubs or Organizations: Not on file  . Attends Archivist Meetings: Not on file  . Marital Status: Not on file  Intimate Partner Violence:   . Fear of Current or Ex-Partner: Not on file  . Emotionally Abused: Not on file  . Physically Abused: Not on file  . Sexually Abused: Not on file    Gloria Christensen's family history includes Colon cancer in an other family member.      Objective:    Vitals:   12/04/19 1531  BP: 112/66  Pulse: 64  Temp: 97.9 F (36.6 C)    Physical Exam Well-developed well-nourished young white female in no acute distress.  Pleasant Weight 151, BMI 24.4  HEENT; nontraumatic normocephalic, EOMI, PE RR LA, sclera  anicteric. Oropharynx; not done Neck; supple, no JVD Cardiovascular; regular rate and rhythm with S1-S2, no murmur rub or gallop Pulmonary; Clear bilaterally Abdomen; soft,  Nondistended, she has some tenderness across the lower abdomen and in the left mid quadrant, mild no guarding or rebound, no palpable mass or hepatosplenomegaly, bowel sounds are active Rectal; not done today Skin; benign exam, no jaundice rash or appreciable lesions Extremities; no clubbing cyanosis or edema skin warm and dry Neuro/Psych; alert and oriented x4, grossly nonfocal mood and affect appropriate       Assessment & Plan:   #46 34 year old white female with onset of current illness about 2 weeks ago with constipation, followed by lower abdominal pain eventually radiating into the right back. Patient took multiple laxatives and continued to have abdominal pain and noted some pinkish blood on the tissue at that time. Eventual ER visit on 11/27/2019 with diagnosis of urinary tract infection and pyelonephritis for which  she is completing a course of Cipro  #2 heme positive stool-in setting of acute episode of obstipation with use of multiple laxatives and noting pinkish blood on the tissue which may have been hemorrhoidal in origin. Patient noted to dark stools last week after she had taken iron tablet, has not noticed any dark stools since.  Hemoglobin is normal as is MCV. I am not sure that she has an underlying GI pathology accounting for the isolated heme positive stool in the above setting.  #2 bipolar disorder #3 remote history of anemia-patient says she has just remained on an iron tablet daily over the past many years has not had blood counts checked.  #4 prior treatment for H. Pylori #5 episode of salmonellosis several years ago  Plan; I think she needs to recover from the pyelonephritis She asks about resuming probiotic/Culturelle.  Advised this would be a good idea post antibiotics and can take for 1  to 2 months but does not need to stay on long-term  hold iron supplement for now She asks about avoiding dairy, she is a vegetarian.  Advise she continue dairy in moderation We will check CBC today, iron studies, and will ask her to complete Hemoccults in 2 weeks. I will plan to see her back in about 3 weeks, reassess and can decide if she needs to undergo endoscopic evaluation. Patient will be established with Dr. Leone Payor.  Shaylynne Lunt S Dontell Mian PA-C 12/04/2019   Cc: Jaymes Graff, MD

## 2019-12-04 NOTE — Patient Instructions (Addendum)
If you are age 32 or older, your body mass index should be between 23-30. Your Body mass index is 24.44 kg/m. If this is out of the aforementioned range listed, please consider follow up with your Primary Care Provider.  If you are age 33 or younger, your body mass index should be between 19-25. Your Body mass index is 24.44 kg/m. If this is out of the aformentioned range listed, please consider follow up with your Primary Care Provider.   Your provider has requested that you go to the basement level for lab work before leaving today. Press "B" on the elevator. The lab is located at the first door on the left as you exit the elevator.  Stop Iron for now. Restart Culturelle daily for 2 weeks.  Use Miralax if needed for constipation.  Okay to have moderate amount of dairy. Push fluids.   Follow the instructions on the Hemoccult cards and mail them back to [place] when you are finished or you may take them directly to the lab in the basement of the [facility] building. We will call you with the results.

## 2019-12-10 ENCOUNTER — Other Ambulatory Visit: Payer: Self-pay

## 2019-12-10 DIAGNOSIS — D509 Iron deficiency anemia, unspecified: Secondary | ICD-10-CM

## 2019-12-10 DIAGNOSIS — R195 Other fecal abnormalities: Secondary | ICD-10-CM

## 2019-12-15 ENCOUNTER — Telehealth: Payer: Self-pay | Admitting: Physician Assistant

## 2019-12-15 NOTE — Telephone Encounter (Signed)
Patient is under treatment for a migraine headache. She is regularly taking Ibuprofen under the doctor's guidance. She read her stool card instruction and saw that she is to avoid the ibuprofen. She agrees to complete the prescribed migraine treatment and then do the stool card. Moved her appointment out by 1 week to allow for her treatment to be completed and card resulted.

## 2019-12-15 NOTE — Telephone Encounter (Signed)
Pls call pt, she has some questions about stool cards.

## 2020-01-01 ENCOUNTER — Ambulatory Visit: Payer: Self-pay | Admitting: Physician Assistant

## 2020-01-02 ENCOUNTER — Telehealth: Payer: Self-pay | Admitting: Physician Assistant

## 2020-01-02 NOTE — Telephone Encounter (Signed)
Last spoke with the patient 12/15/19. She was to complete stool cards as a part of the work up of heme + stool. She was under treamtnet at that time for migraine and was taking NSAIDs. We moved the appointment to give her time to complete that treatment. She calls today. She is menstruating now. Again unable to complete the stool cards. Offered appointment a week of 01/12/20. There is a schedule conflict for her. Agrees to an appointment 02/03/20 for follow up.

## 2020-01-02 NOTE — Telephone Encounter (Signed)
Pt stated that she was supposed to do her stool cards yesterday but is on her period.  Please advise.

## 2020-01-08 ENCOUNTER — Ambulatory Visit: Payer: Self-pay | Admitting: Physician Assistant

## 2020-01-16 ENCOUNTER — Other Ambulatory Visit (INDEPENDENT_AMBULATORY_CARE_PROVIDER_SITE_OTHER): Payer: BC Managed Care – PPO

## 2020-01-16 ENCOUNTER — Other Ambulatory Visit: Payer: Self-pay

## 2020-01-16 DIAGNOSIS — R195 Other fecal abnormalities: Secondary | ICD-10-CM

## 2020-01-16 DIAGNOSIS — K5900 Constipation, unspecified: Secondary | ICD-10-CM

## 2020-01-16 DIAGNOSIS — D509 Iron deficiency anemia, unspecified: Secondary | ICD-10-CM

## 2020-01-16 LAB — HEMOCCULT SLIDES (X 3 CARDS)
Fecal Occult Blood: NEGATIVE
OCCULT 1: NEGATIVE
OCCULT 2: NEGATIVE
OCCULT 3: NEGATIVE
OCCULT 4: NEGATIVE
OCCULT 5: NEGATIVE

## 2020-02-03 ENCOUNTER — Other Ambulatory Visit (INDEPENDENT_AMBULATORY_CARE_PROVIDER_SITE_OTHER): Payer: BC Managed Care – PPO

## 2020-02-03 ENCOUNTER — Ambulatory Visit: Payer: BC Managed Care – PPO | Admitting: Physician Assistant

## 2020-02-03 ENCOUNTER — Encounter: Payer: Self-pay | Admitting: Physician Assistant

## 2020-02-03 VITALS — BP 104/68 | HR 68 | Temp 98.5°F | Ht 65.75 in | Wt 149.1 lb

## 2020-02-03 DIAGNOSIS — R140 Abdominal distension (gaseous): Secondary | ICD-10-CM

## 2020-02-03 DIAGNOSIS — R109 Unspecified abdominal pain: Secondary | ICD-10-CM | POA: Diagnosis not present

## 2020-02-03 DIAGNOSIS — Z8639 Personal history of other endocrine, nutritional and metabolic disease: Secondary | ICD-10-CM

## 2020-02-03 LAB — SEDIMENTATION RATE: Sed Rate: 25 mm/hr — ABNORMAL HIGH (ref 0–20)

## 2020-02-03 LAB — C-REACTIVE PROTEIN: CRP: <1 mg/dL (ref 0.5–20.0)

## 2020-02-03 LAB — IGA: IgA: 267 mg/dL (ref 68–378)

## 2020-02-03 NOTE — Patient Instructions (Signed)
If you are age 33 or older, your body mass index should be between 23-30. Your Body mass index is 24.25 kg/m. If this is out of the aforementioned range listed, please consider follow up with your Primary Care Provider.  If you are age 58 or younger, your body mass index should be between 19-25. Your Body mass index is 24.25 kg/m. If this is out of the aformentioned range listed, please consider follow up with your Primary Care Provider.   Your provider has requested that you go to the basement level for lab work before leaving today. Press "B" on the elevator. The lab is located at the first door on the left as you exit the elevator.  Continue oral iron twice daily.  Follow up as needed

## 2020-02-03 NOTE — Progress Notes (Signed)
Subjective:    Patient ID: Gloria Christensen, female    DOB: 1986-04-19, 34 y.o.   MRN: [id number]  HPI Gloria Christensen is a pleasant 34 year old white female, recently established and seen by myself on 12/04/2019, who comes in today for follow-up.  She had presented with 2 to 3-week history of new onset of constipation, lower abdominal pain radiating into her right back.  She had an ER visit with CT of the abdomen and pelvis which was consistent with UTI and probable early pyelonephritis without abscess.  There is no evidence of any bowel obstruction or active inflammation, normal appendix. She had been hemocculted and this was positive, but that was in the setting of significant obstipation and use of multiple laxatives.  She had a normal CBC. Patient does have long history of taking iron supplementation.  She says that she had been told she was iron deficient when she was about 13 and has been on some sort of iron supplement ever since.  She does not know why she was iron deficient, had eaten a normal diet as a child, As an adult she currently has very light menses and has never had heavy periods. When seen in the office she was feeling significantly better with treatment of pyelonephritis, still having some mild lower abdominal discomfort and bowel movements had returned to normal. She had asked about probiotics and was advised Culturelle or similar product would be a good idea post antibiotics for 1 to 2 months. She was asked to repeat Hemoccults in 2 weeks. We did iron studies and hemoglobin on 12/04/2019 with hemoglobin 12.1 hematocrit of 36.5, serum iron 37 TIBC 294 and iron saturation of 9.  She increased her oral iron supplement to twice daily. She comes in today saying that she is feeling a whole lot better.  She had been in to see her primary care doctor about a month ago who advised her to try a anti-inflammatory diet and she has been for the most part off gluten sugar and dairy over the past month.   She says she does feel significantly better, well enough to continue the diet.  She is no longer having any problems with constipation or abdominal bloating postprandially, no abdominal pain or cramping and is having good bowel movements. Hemoccults returned negative x3 from 01/16/2020. Patient mentions that she is vegetarian, no family history of celiac disease but her PCP told her she was probably gluten sensitive.  She did eat a piece of cake this past weekend and says she did have abdominal cramping afterwards. No family history of IBD or celiac disease.  Review of Systems Pertinent positive and negative review of systems were noted in the above HPI section.  All other review of systems was otherwise negative.  Outpatient Encounter Medications as of 02/03/2020  Medication Sig  . cetirizine (ZYRTEC) 10 MG tablet Take 10 mg by mouth daily.  . ferrous gluconate (FERGON) 240 (27 FE) MG tablet Take 240 mg by mouth in the morning and at bedtime.  . lamoTRIgine (LAMICTAL) 100 MG tablet Take 100 mg by mouth every evening.   . norgestimate-ethinyl estradiol (ORTHO-CYCLEN,SPRINTEC,PREVIFEM) 0.25-35 MG-MCG tablet Take 1 tablet by mouth daily.  . [DISCONTINUED] clotrimazole-betamethasone (LOTRISONE) cream Apply 1 application topically 2 (two) times daily.  . [DISCONTINUED] ferrous gluconate (FERGON) 324 MG tablet Take 324 mg by mouth daily with breakfast.  . [DISCONTINUED] HYDROcodone-acetaminophen (NORCO/VICODIN) 5-325 MG tablet Take 1 tablet by mouth every 6 (six) hours as needed.  . [DISCONTINUED] Lactobacillus-Inulin (CULTURELLE  DIGESTIVE DAILY PO) Take 1 capsule by mouth daily.  . [DISCONTINUED] ondansetron (ZOFRAN ODT) 4 MG disintegrating tablet Take 1 tablet (4 mg total) by mouth every 8 (eight) hours as needed for nausea or vomiting.   No facility-administered encounter medications on file as of 02/03/2020.   Allergies  Allergen Reactions  . Ceclor [Cefaclor] Hives  . Ferrous Sulfate Nausea And  Vomiting  . Sulfur Hives   Patient Active Problem List   Diagnosis Date Noted  . Acute pyelonephritis 12/04/2019  . 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 Other malaise and fatigue 12/03/2012  . Irregular menstrual bleeding 12/03/2012   Social History   Socioeconomic History  . Marital status: Single    Spouse name: Not on file  . Number of children: Not on file  . Years of education: Not on file  . Highest education level: Not on file  Occupational History  . Not on file  Tobacco Use  . Smoking status: Never Smoker  . Smokeless tobacco: Never Used  Substance and Sexual Activity  . Alcohol use: Never    Alcohol/week: 0.0 standard drinks  . Drug use: Never  . Sexual activity: Never  Other Topics Concern  . Not on file  Social History Narrative  . Not on file   Social Determinants of Health   Financial Resource Strain:   . Difficulty of Paying Living Expenses:   Food Insecurity:   . Worried About Programme researcher, broadcasting/film/video in the Last Year:   . Barista in the Last Year:   Transportation Needs:   . Freight forwarder (Medical):   Marland Kitchen Lack of Transportation (Non-Medical):   Physical Activity:   . Days of Exercise per Week:   . Minutes of Exercise per Session:   Stress:   . Feeling of Stress :   Social Connections:   . Frequency of Communication with Friends and Family:   . Frequency of Social Gatherings with Friends and Family:   . Attends Religious Services:   . Active Member of Clubs or Organizations:   . Attends Banker Meetings:   Marland Kitchen Marital Status:   Intimate Partner Violence:   . Fear of Current or Ex-Partner:   . Emotionally Abused:   Marland Kitchen Physically Abused:   . Sexually Abused:     Gloria Christensen family history includes Colon cancer in an other family member.      Objective:    Vitals:   02/03/20 1518  BP: 104/68  Pulse: 68  Temp: 98.5 F (36.9 C)    Physical Exam Well-developed  well-nourished female in no acute distress.  Height, [id number], BMI 24.5  HEENT; nontraumatic normocephalic, EOMI, PER R LA, sclera anicteric.  Extremities; no clubbing cyanosis or edema skin warm and dry Neuro/Psych; alert and oriented x4, grossly nonfocal mood and affect appropriate      Assessment & Plan:   #62 34 year old white female with recent episode of pyelonephritis, complicated by obstipation around the same time as diagnosis.  Patient had a Hemoccult positive stool during ER visit in the midst of that episode and use of multiple laxatives.  Pyelonephritis has completely resolved. Patient is currently feeling very good, she has been on an anti-inflammatory diet over the past month and though she says it is very bland she has had enough improvement in her GI symptoms to want to continue the diet.  I think she has underlying IBS, and may be gluten sensitive,  will rule out celiac disease  #2 repeat Hemoccults have returned negative  #3 Long history of iron deficiency since age 36-etiology not clear, rule out celiac disease  #4 prior history of salmonellosis #5 previous H. pylori-treated  Plan; we will check sed rate, CRP, TTG and IgA. Continue oral iron supplement twice daily. She should have repeat iron studies in June or July to document improvement. We discussed potential need for EGD if celiac markers positive, for duodenal biopsies to confirm diagnosis.  If markers are negative, then unlikely to have celiac disease, but can continue gluten-free diet for gluten sensitivity. She was advised she may want to give herself a trial of a small amount of dairy and assess for symptoms, as she is vegetarian and had generally use dairy as a protein source.    Gloria Christensen S Marquerite Forsman PA-C 02/03/2020   Cc: Jaymes Graff, MD

## 2020-02-04 LAB — TISSUE TRANSGLUTAMINASE, IGA: (tTG) Ab, IgA: 1 U/mL

## 2020-05-17 ENCOUNTER — Ambulatory Visit: Payer: BC Managed Care – PPO

## 2020-05-26 ENCOUNTER — Ambulatory Visit: Payer: BC Managed Care – PPO

## 2020-06-04 ENCOUNTER — Other Ambulatory Visit: Payer: Self-pay

## 2020-06-04 ENCOUNTER — Ambulatory Visit: Payer: BC Managed Care – PPO | Attending: Obstetrics and Gynecology | Admitting: Maternal & Fetal Medicine

## 2020-06-04 DIAGNOSIS — O99891 Other specified diseases and conditions complicating pregnancy: Secondary | ICD-10-CM

## 2020-06-04 DIAGNOSIS — O09891 Supervision of other high risk pregnancies, first trimester: Secondary | ICD-10-CM

## 2020-06-04 DIAGNOSIS — Z3169 Encounter for other general counseling and advice on procreation: Secondary | ICD-10-CM

## 2020-06-04 DIAGNOSIS — F319 Bipolar disorder, unspecified: Secondary | ICD-10-CM | POA: Diagnosis not present

## 2020-06-04 DIAGNOSIS — O99340 Other mental disorders complicating pregnancy, unspecified trimester: Secondary | ICD-10-CM

## 2020-06-04 DIAGNOSIS — F509 Eating disorder, unspecified: Secondary | ICD-10-CM

## 2020-06-04 NOTE — Progress Notes (Addendum)
MFM preconception consultation video visit.  I confirmed that identification of Gloria Christensen through two identifiers. I reviewed the risk and benefits of a video visit. Gloria Christensen was at her home and I was located remotely.    Date of service: 06/04/20 Reason for visit: Preconception consultation Requesting provider: Dr. Jaymes Graff  Gloria Christensen is a 33 yo G1P0 who is seen over video consultation for a preconception visit.  She is overall doing well without complaints. She has regular periods with an LMP of 05/20/20 . She as no concerns for infertility.   Her signifcant medical history includes bipolar disease which she states is overall stable. 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 She states that her eating challenges began in high school but woresned in college when she lost her parental support. She under went intensive psychotherapy and was started on lamictal. In addition, during this evaluation there was concerned that she was suicidal. She initially began Lamotrigine 125 mg daily but was more recently decreased to 100 mg. She has not had difficulty with eating since 34 yo. She sees continues in therapy in [place]. She is also a Runner, broadcasting/film/video and in a thriving marriage.  She states that she still get anxious but not depressed. She is concerned about the eating disorder resurfacing due to pregnancy.  She recently was treated for pyelonephritis but has now resolved. Gloria Christensen was also seen by gastroenterologist regarding recent abdominal pain she the work up was negative including celiacs disease but concluded mild iron deficiency.  Vitals with BMI 02/03/2020 12/04/2019 11/28/2019  Height 5' 5.75" 5\' 6"  -  Weight 149 lbs 2 oz 151 lbs 6 oz -  BMI 24.25 24.45 -  Systolic 104 112  Diastolic 68 66 74  Pulse 68 64 100   CBC Latest Ref Rng & Units 12/04/2019 11/27/2019 04/25/2019  WBC 4.0 - 10.5 K/uL 5.6 18.2(H) 18.4(H)  Hemoglobin 12.0 - 15.0 g/dL  06/26/2019 61.9 50.9  Hematocrit 36 - 46 % 36.5 36.9 41.1  Platelets 150 - 400 K/uL 551.0(H) 394 197   CMP Latest Ref Rng & Units 11/27/2019 04/25/2019 02/11/2016  Glucose 70 - 99 mg/dL 02/13/2016) 712(W) 91  BUN 6 - 20 mg/dL 8 13 7   Creatinine 0.44 - 1.00 mg/dL 580(D 9.83  Sodium 135 - 145 mmol/L 135 138 136  Potassium 3.5 - 5.1 mmol/L 3.6 4.6 4.5  Chloride 98 - 111 mmol/L 101 103 100  CO2 22 - 32 mmol/L 22 26 26   Calcium 8.9 - 10.3 mg/dL 8.9 3.82) 9.2  Total Protein 6.5 - 8.1 g/dL 7.5 6.8 7.6  Total Bilirubin 0.3 - 1.2 mg/dL 1.1 0.4 0.3  Alkaline Phos 38 - 126 U/L 57 53 54  AST 15 - 41 U/L 15 16 14   ALT 0 - 44 U/L 13 14 11    Past Medical History:  Diagnosis Date  . Allergy   . Anemia   . Bipolar disorder, unspecified (HCC)    Past Surgical History:  Procedure Laterality Date  . LASIK  2011  . WISDOM TOOTH EXTRACTION     Social History   Socioeconomic History  . Marital status: Single    Spouse name: Not on file  . Number of children: Not on file  . Years of education: Not on file  . Highest education level: Not on file  Occupational History  . Not on file  Tobacco Use  . Smoking status: Never Smoker  . Smokeless tobacco:  Never Used  Vaping Use  . Vaping Use: Never used  Substance and Sexual Activity  . Alcohol use: Never    Alcohol/week: 0.0 standard drinks  . Drug use: Never  . Sexual activity: Never  Other Topics Concern  . Not on file  Social History Narrative  . Not on file   Social Determinants of Health   Financial Resource Strain:   . Difficulty of Paying Living Expenses:   Food Insecurity:   . Worried About Programme researcher, broadcasting/film/video in the Last Year:   . Barista in the Last Year:   Transportation Needs:   . Freight forwarder (Medical):   Marland Kitchen Lack of Transportation (Non-Medical):   Physical Activity:   . Days of Exercise per Week:   . Minutes of Exercise per Session:   Stress:   . Feeling of Stress :   Social Connections:   . Frequency of  Communication with Friends and Family:   . Frequency of Social Gatherings with Friends and Family:   . Attends Religious Services:   . Active Member of Clubs or Organizations:   . Attends Banker Meetings:   Marland Kitchen Marital Status:   Intimate Partner Violence:   . Fear of Current or Ex-Partner:   . Emotionally Abused:   Marland Kitchen Physically Abused:   . Sexually Abused:    Allergies  Allergen Reactions  . Ceclor [Cefaclor] Hives  . Ferrous Sulfate Nausea And Vomiting  . Sulfur Hives   Current Outpatient Medications on File Prior to Visit  Medication Sig Dispense Refill  . cetirizine (ZYRTEC) 10 MG tablet Take 10 mg by mouth daily.    . ferrous gluconate (FERGON) 240 (27 FE) MG tablet Take 240 mg by mouth in the morning and at bedtime.    . lamoTRIgine (LAMICTAL) 100 MG tablet Take 100 mg by mouth every evening.     . norgestimate-ethinyl estradiol (ORTHO-CYCLEN,SPRINTEC,PREVIFEM) 0.25-35 MG-MCG tablet Take 1 tablet by mouth daily.     No current facility-administered medications on file prior to visit.     Impression/Counseling:  I discussed Gloria Christensen's history of bipolar/eating disorder.  While Gloria Christensen doesn't have a predominant depressive or manic element. She does have a stress response not eating. She notes that her mother is not very functional and is diagnosed with bipolar disorder. Therefore, she feels that she has something but is functional and knows that Lamotrignine helped her get control over her life and therefore wants to stay on it.   I discussed the increased risk for depression especially in the postpartum period and therefore potentially a new stressors as it relates to trigger her stress response.  We discussed bipolar disease in general. In particular, of mania and bipolar disorder is about 1% and, in contrast to depression, there is no significant gender difference. Despite this, there appears to be a relationship between childbirth and the first episode of  mania in women with bipolar illness, as noted by reports that childbirth predated the first episode of mania in 1 of 4 women with this illness. Overall, the risk of relapse of mania after delivery is estimated to be between 1 in 3 and 1 in 4. Risks to the mother and fetus result from the impulsive, disinhibited, or high-risk behavior that is seen in mania, as well as from the commonly associated substance abuse and dependence. In addition, women with untreated mania or hypomania are unlikely to participate in appropriate prenatal care or maintain an adequate diet.  Although medication maintenance is the mainstay of treatment for bipolar disorder, psychotherapy and educational interventions are critical adjuncts to pharmacotherapy.  Several classes of drugs are indicated for the treatment of mania. Lithium, some antiepileptic drugs (AEDs), and several newer atypical antipsychotic medications are efficacious; however, these agents result in a treatment response in only 36% to 50% of cases.   Therefore, patients often receive more than one drug to manage their mood symptoms. Strategies to minimize fetal exposure include the following: (1) use the lowest effective dose(s), (2) minimize the number of drugs to achieve response, and (3) divide daily doses to avoid high peak serum concentrations.  We discussed the use of Lamotrigine of 100 mg per day. I reviewed the safety profile of Lamotrigine. We dicussed that there were a diverse group of studies in conflict on whether it causes fetal malformations most studies have not been reproducible especially as it relates to an oral cleft is concerned. The conclusion is that Lamotrigine has been suspected in one registry but not confirmed in other registries or large trial. Lamotringine should be used as a monotherapy and at the lowest effective dose. Folic acid supplementation of 4 mg should be used. Breastfeeding is recommended.  In conclusion, while there is a  suspected relationship between Lamotringine and oral clefts Gloria Christensen and I discussed the risk benefits of discontinuing the medication in the first trimester and/or during pregnancy. A this time she is agreement of maintaining the medication during the pregnancy. She will take folic acid and obtain a detailed ultrasound between 18-20 weeks.   I spent 45 minute with > 50% in face to face video consultation with Gloria Christensen.  All questions answered.  Novella Olive, MD

## 2020-08-09 IMAGING — CT CT ABD-PELV W/ CM
2 of 4 series · 16 of 46 positions shown, 18 images · IV contrast (OMNIPAQUE 300)
Comparison: CT abdomen pelvis dated 04/25/2019.

CLINICAL DATA: 33-year-old female with abdominal pain and dark
stools.

EXAM:
CT ABDOMEN AND PELVIS WITH CONTRAST
TECHNIQUE: Multidetector CT imaging of the abdomen and pelvis was performed
using the standard protocol following bolus administration of
intravenous contrast.
CONTRAST:  100mL OMNIPAQUE IOHEXOL 300 MG/ML  SOLN

[Series 2: axial st · axial · 0.62mm/px · z∈[-496,-116]mm · 13 of 86 slices shown, 15 images]
[im 5/86  soft-tissue]
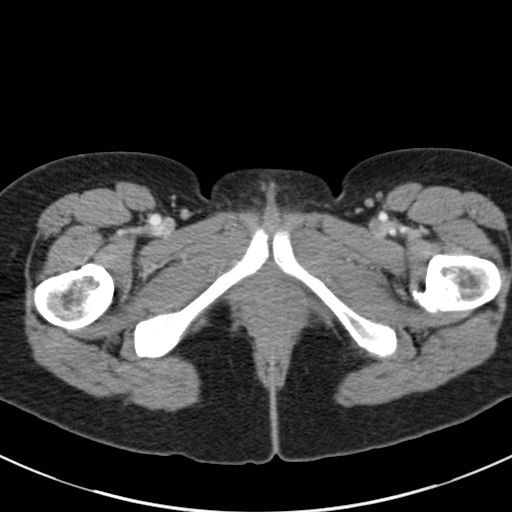
[im 5/86  bone]
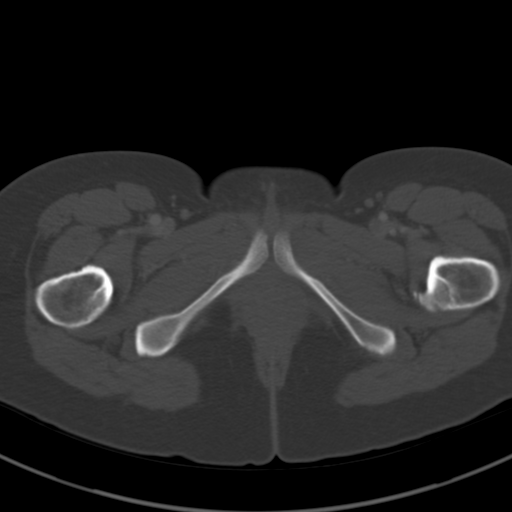
[im 10/86  soft-tissue]
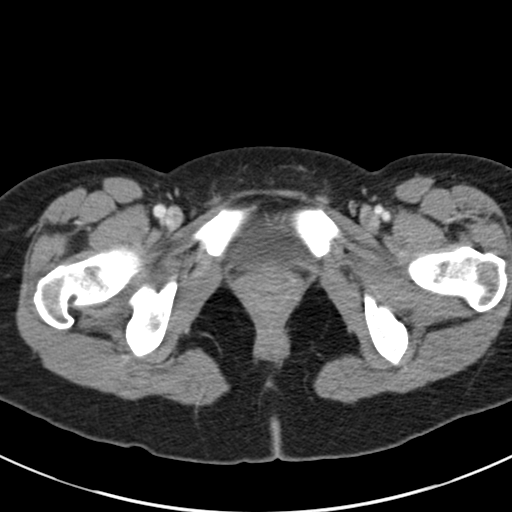
[im 19/86  soft-tissue]
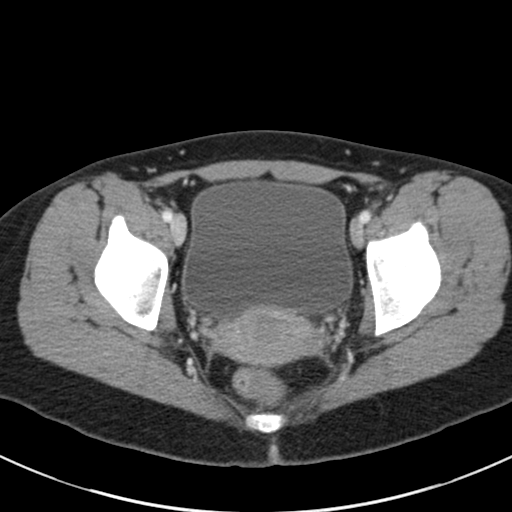
[im 24/86  soft-tissue]
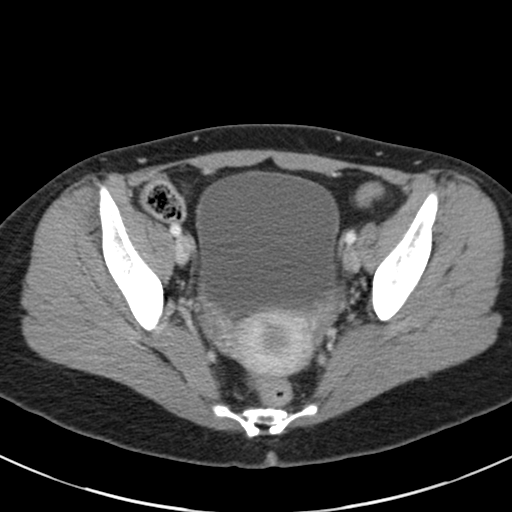
[im 29/86  soft-tissue]
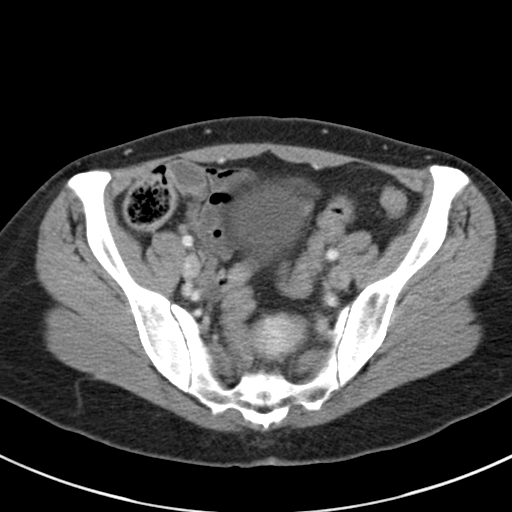
[im 38/86  soft-tissue]
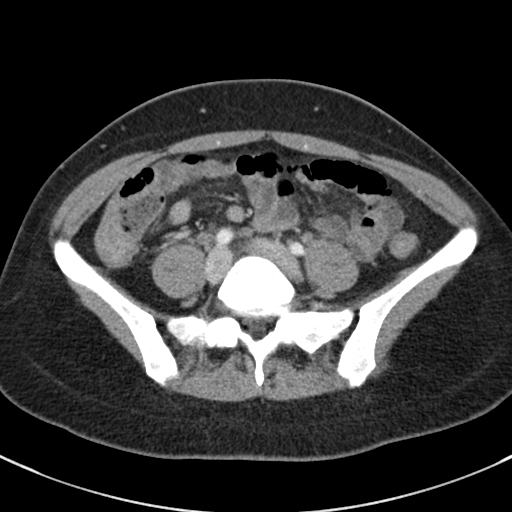
[im 43/86  soft-tissue]
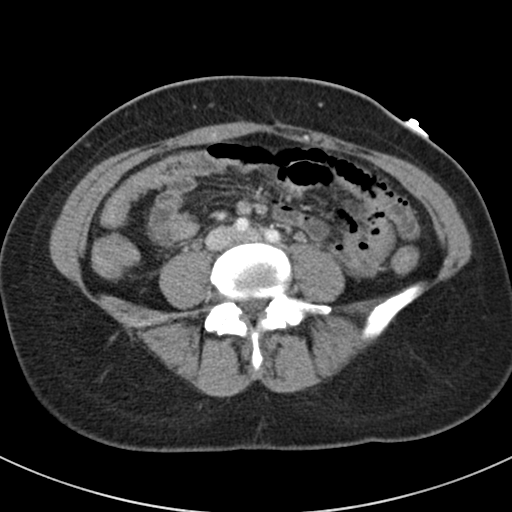
[im 48/86  soft-tissue]
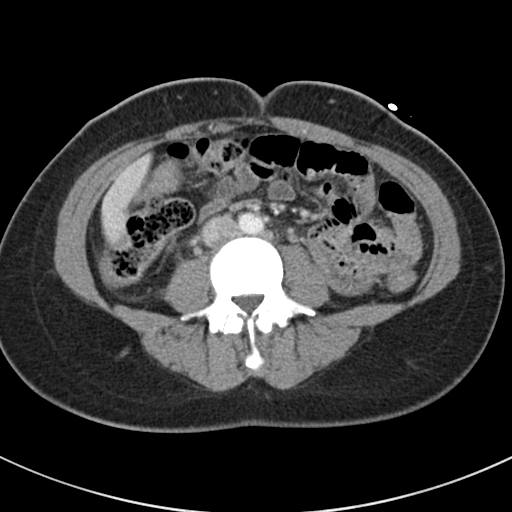
[im 57/86  soft-tissue]
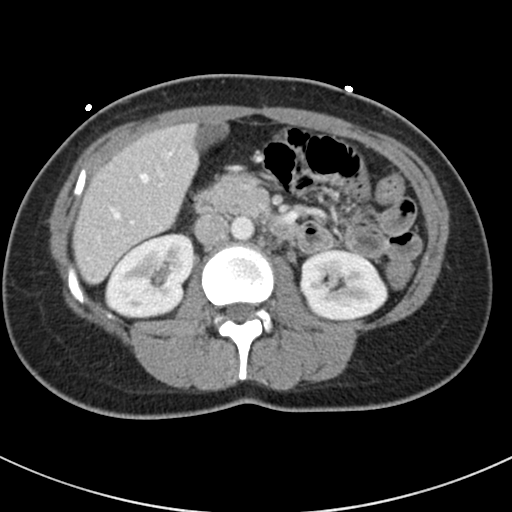
[im 57/86  bone]
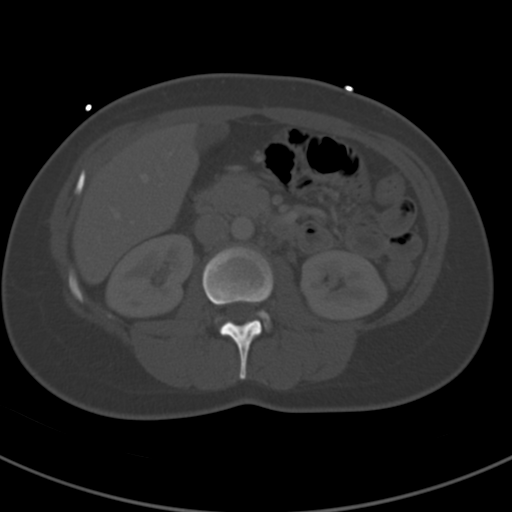
[im 62/86  soft-tissue]
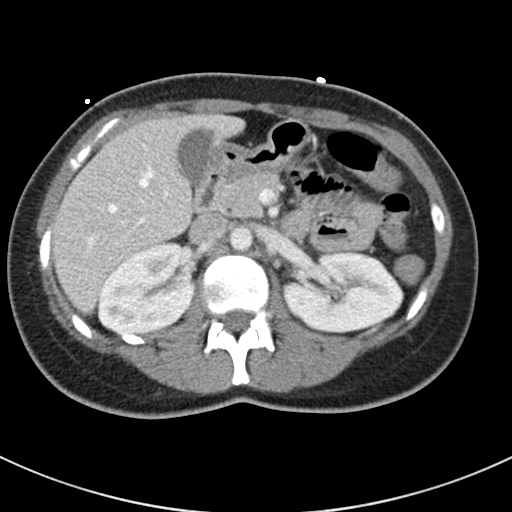
[im 67/86  soft-tissue]
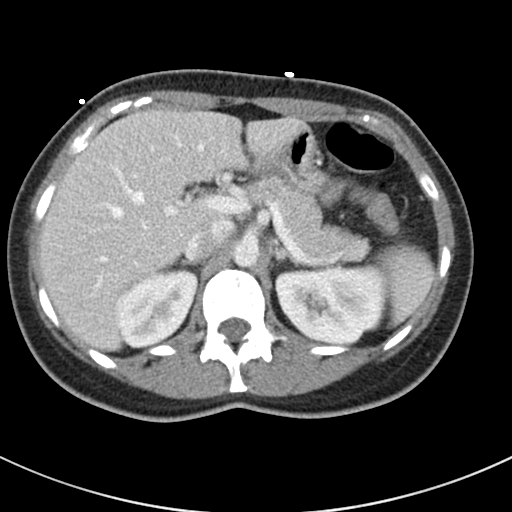
[im 76/86  soft-tissue]
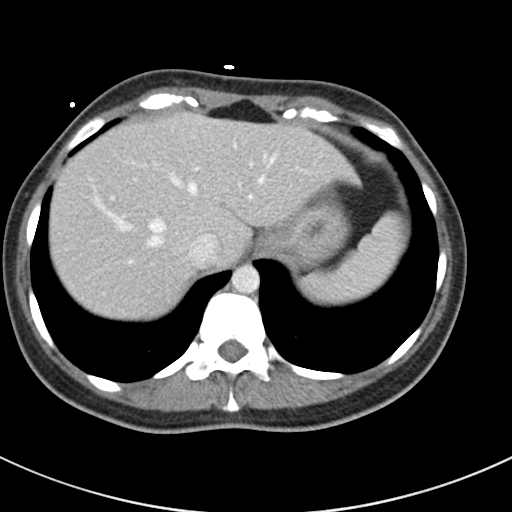
[im 81/86  soft-tissue]
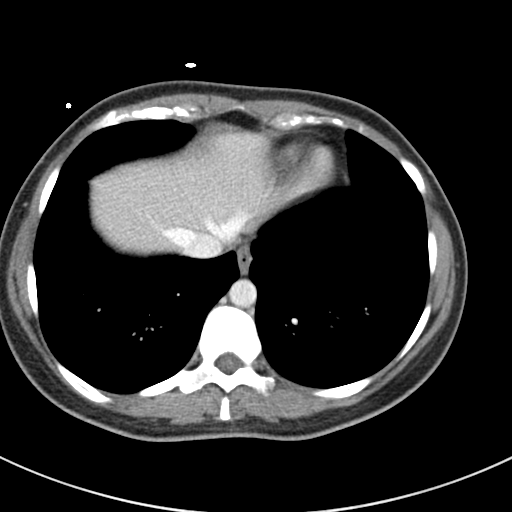

[Series 5: coronal st · coronal · 0.60mm/px · 3 of 104 slices shown]
[im 35/104  soft-tissue]
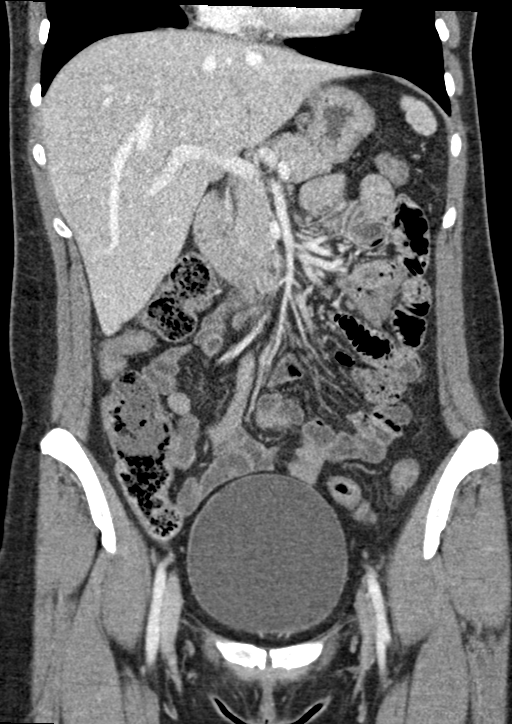
[im 46/104  soft-tissue]
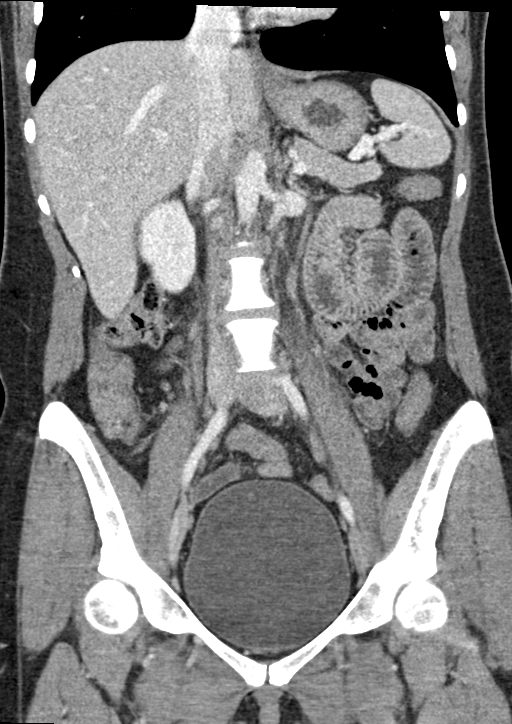
[im 58/104  soft-tissue]
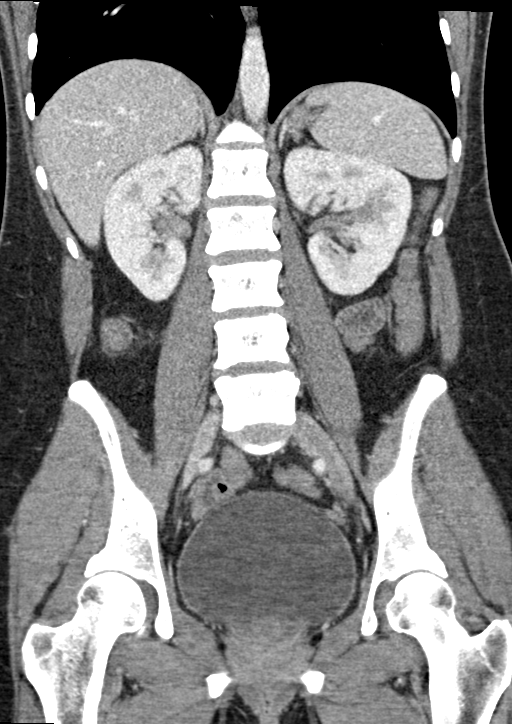

[16 of 46 positions shown; findings below may reference images not displayed]

FINDINGS: Lower chest: The visualized lung bases are clear.

No intra-abdominal free air or free fluid.

Hepatobiliary: No focal liver abnormality is seen. No gallstones,
gallbladder wall thickening, or biliary dilatation.

Pancreas: Unremarkable. No pancreatic ductal dilatation or
surrounding inflammatory changes.

Spleen: Normal in size without focal abnormality.

Adrenals/Urinary Tract: The adrenal glands are unremarkable. There
is no hydronephrosis on either side. There is urothelial enhancement
of the collecting systems and ureters, right greater left most
consistent with UTI and possible early pyelonephritis. Correlation
with urinalysis recommended. No drainable fluid collection or
abscess. The urinary bladder is unremarkable.

Stomach/Bowel: There is no bowel obstruction or active inflammation.
The appendix is normal.

Vascular/Lymphatic: The abdominal aorta and IVC are unremarkable. No
portal venous gas. There is no adenopathy.

Reproductive: The uterus is grossly unremarkable. No adnexal masses.

Other: Small fat containing umbilical hernia.

Musculoskeletal: No acute or significant osseous findings.
IMPRESSION: 1. Findings most consistent with UTI and probable early
pyelonephritis. No abscess.
2. No bowel obstruction or active inflammation.  Normal appendix.

## 2022-08-08 ENCOUNTER — Ambulatory Visit
Admission: EM | Admit: 2022-08-08 | Discharge: 2022-08-08 | Disposition: A | Discharge status: Home / Self Care | Payer: BC Managed Care – PPO

## 2022-08-08 DIAGNOSIS — F418 Other specified anxiety disorders: Secondary | ICD-10-CM | POA: Diagnosis not present

## 2022-08-08 LAB — POCT FASTING CBG KUC MANUAL ENTRY: POCT Glucose (KUC): 114 mg/dL — AB (ref 70–99)

## 2022-08-08 NOTE — ED Triage Notes (Signed)
Pt states she is [redacted] weeks pregnant and states today she felt like she had an elevated HR. 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 Pt c/o chest tightness (no chest pain), and no active SOB.

## 2022-08-08 NOTE — ED Provider Notes (Signed)
[facility]    CSN: [id number] Arrival date & time: 08/08/22  1818    HISTORY   Chief Complaint  Patient presents with   Tachycardia   HPI Gloria Christensen is a pleasant, 36 y.o. female who presents to urgent care today. Pt states she is [redacted] weeks pregnant and states today she felt like she had an elevated HR. 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  Pt c/o chest tightness (no chest pain), and no active SOB.  Heart rate is 89 on arrival today.  Patient reports having had a miscarriage 4 months ago, still experiencing a lot of grief over this.  The history is provided by the patient.   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 Authorizing Provider  cetirizine (ZYRTEC) 10 MG tablet Take 10 mg by mouth daily.    [provider]  escitalopram (LEXAPRO) 10 MG tablet TAKE 1/2 TABLET BY MOUTH IN THE MORNING    [provider]  ferrous gluconate (FERGON) 240 (27 FE) MG tablet Take 240 mg by mouth in the morning and at bedtime.    [provider]  folic acid (FOLVITE) 725 MCG tablet Take 1 tablet every day by oral route as directed for 30 days.    [provider]  lamoTRIgine (LAMICTAL) 100 MG tablet  Take 100 mg by mouth every evening.  11/14/19   [provider]  norgestimate-ethinyl estradiol (ORTHO-CYCLEN,SPRINTEC,PREVIFEM) 0.25-35 MG-MCG tablet Take 1 tablet by mouth daily.    [provider]    Family History Family History  Problem Relation Age of Onset   Colon cancer Other        unknown family member   Social History Social History   Tobacco Use   Smoking status: Never   Smokeless tobacco: Never  Vaping Use   Vaping Use: Never used  Substance Use Topics   Alcohol use: Never    Alcohol/week: 0.0 standard drinks of alcohol   Drug use: Never   Allergies   Ceclor [cefaclor], Elemental sulfur, and Ferrous sulfate  Review of Systems Review of Systems Pertinent findings revealed after performing a 14 point review of systems has been noted in the history of present illness.  Physical Exam Triage Vital Signs ED Triage Vitals  Enc Vitals Group     BP 08/19/21 0827 (!) 147/82     Pulse Rate  08/19/21 0827 72     Resp 08/19/21 0827 18     Temp 08/19/21 0827 98.3 F (36.8 C)     Temp Source 08/19/21 0827 Oral     SpO2 08/19/21 0827 98 %     Weight --      Height --      Head Circumference --      Peak Flow --      Pain Score 08/19/21 0826 5     Pain Loc --      Pain Edu? --      Excl. in GC? --   No data found.  Updated Vital Signs BP 133/77 (BP Location: Left Arm)   Pulse 89   Temp 99 F (37.2 C) (Oral)   Resp 16   SpO2 98%   Physical Exam  Visual Acuity Right Eye Distance:   Left Eye Distance:   Bilateral Distance:    Right Eye Near:   Left Eye Near:    Bilateral Near:     [facility] / Diagnostics / Procedures:     Radiology No results found.  Procedures ED EKG  Date/Time: 08/08/2022 7:27 PM  Performed by: Theadora Rama Scales, PA-C Authorized by: Theadora Rama Scales, PA-C   ECG reviewed by ED Physician in the absence of a cardiologist: yes   Previous ECG:    Previous ECG:  Unavailable Interpretation:     Interpretation: normal   Rate:    ECG rate assessment: normal   Rhythm:    Rhythm: sinus rhythm   Ectopy:    Ectopy: none   QRS:    QRS axis:  Normal   QRS intervals:  Normal   QRS conduction: normal   ST segments:    ST segments:  Normal T waves:    T waves: normal   Q waves:    Abnormal Q-waves: not present    (including critical care time) EKG  Pending results:  Labs Reviewed  POCT FASTING CBG [facility] MANUAL ENTRY - Abnormal; Notable for the following components:      Result Value   POCT Glucose ([facility]) 114 (*)    All other components within normal limits    Medications Ordered in UC: Medications - No data to display  UC Diagnoses / Final Clinical Impressions(s)   I have reviewed the triage vital signs and the nursing notes.  Pertinent labs & imaging results that were available during my care of the patient were reviewed by me and considered in my medical decision making (see chart for details).    Final diagnoses:  Anxiety about health   ***  ED Prescriptions   None    PDMP not reviewed this encounter.  Pending results:  Labs Reviewed  POCT FASTING CBG [facility] MANUAL ENTRY - Abnormal; Notable for the following components:      Result Value   POCT Glucose ([facility]) 114 (*)    All other components within normal limits    Discharge Instructions:   Discharge Instructions      Your EKG today is normal and not concerning for any cardiac abnormalities or abnormal rhythm.  Your blood sugar level was 114 which is normal.  Please remember that between 70% and 80% of women of childbearing age have at least 1 miscarriage throughout their childbearing years and the vast majority of them never know.    Unfortunately, you did know about your miscarriage.  Having a miscarriage is sad.  It can make you not trust your own  body.  Most miscarriages within the first trimester are the result of a nonviable fetus, not an unhealthy or reckless mother.    Trust your body and your  instincts.  Take your vitamins.  Drink your milk.  Get plenty of rest.  You are going to be a great mom.      Disposition Upon Discharge:  Condition: stable for discharge home  Patient presented with an acute illness with associated systemic symptoms and significant discomfort requiring urgent management. In my opinion, this is a condition that a prudent lay person (someone who possesses an average knowledge of health and medicine) may potentially expect to result in complications if not addressed urgently such as respiratory distress, impairment of bodily function or dysfunction of bodily organs.   Routine symptom specific, illness specific and/or disease specific instructions were discussed with the patient and/or caregiver at length.   As such, the patient has been evaluated and assessed, work-up was performed and treatment was provided in alignment with urgent care protocols and evidence based medicine.  Patient/parent/caregiver has been advised that the patient may require follow up for further testing and treatment if the symptoms continue in spite of treatment, as clinically indicated and appropriate.  Patient/parent/caregiver has been advised to return to the [facility] or PCP if no better; to PCP or the Emergency Department if new signs and symptoms develop, or if the current signs or symptoms continue to change or worsen for further workup, evaluation and treatment as clinically indicated and appropriate  The patient will follow up with their current PCP if and as advised. If the patient does not currently have a PCP we will assist them in obtaining one.   The patient may need specialty follow up if the symptoms continue, in spite of conservative treatment and management, for further workup, evaluation, consultation and treatment as clinically indicated and appropriate.   Patient/parent/caregiver verbalized understanding and agreement of plan as discussed.  All questions were addressed  during visit.  Please see discharge instructions below for further details of plan.  This office note has been dictated using Teaching laboratory technician.  Unfortunately, this method of dictation can sometimes lead to typographical or grammatical errors.  I apologize for your inconvenience in advance if this occurs.  Please do not hesitate to reach out to me if clarification is needed.

## 2022-08-08 NOTE — Discharge Instructions (Addendum)
Your EKG today is normal and not concerning for any cardiac abnormalities or abnormal rhythm.  Your blood sugar level was 114 which is normal.  Please remember that between 70% and 80% of women of childbearing age have at least 1 miscarriage throughout their childbearing years and the vast 26 of them never know.    Unfortunately, you did know about your miscarriage.  Having a miscarriage is sad.  It can make you not trust your own body.  Most miscarriages within the first trimester are the result of a nonviable fetus, not an unhealthy or reckless mother.    Trust your body and your instincts.  Take your vitamins.  Drink your milk.  Get plenty of rest.  You are going to be a great mom.

## 2022-09-07 LAB — OB RESULTS CONSOLE HIV ANTIBODY (ROUTINE TESTING): HIV: NONREACTIVE

## 2022-09-07 LAB — OB RESULTS CONSOLE HEPATITIS B SURFACE ANTIGEN: Hepatitis B Surface Ag: NEGATIVE

## 2022-09-07 LAB — OB RESULTS CONSOLE RUBELLA ANTIBODY, IGM: Rubella: NON-IMMUNE/NOT IMMUNE

## 2022-09-07 LAB — OB RESULTS CONSOLE RPR: RPR: NONREACTIVE

## 2022-09-07 LAB — HEPATITIS C ANTIBODY: HCV Ab: NEGATIVE

## 2022-09-13 ENCOUNTER — Other Ambulatory Visit: Payer: Self-pay

## 2022-09-15 ENCOUNTER — Other Ambulatory Visit: Payer: Self-pay

## 2022-10-23 NOTE — L&D Delivery Note (Signed)
Delivery Note Labor onset: 03/08/2023  Labor Onset Time: 2200 Complete dilation at 9:35 AM  Onset of pushing at 0940 FHR second stage Cat 1 and 2 Analgesia/Anesthesia intrapartum: epidural  Guided pushing with maternal urge. Delivery of a viable female at 60. Fetal head delivered in ROA position.  Nuchal cord: none.  Infant placed on maternal abd, dried, and tactile stim.  Cord double clamped after 4 min and cut by Chrissie Noa, father.  RN x2 present for birth.  Cord blood sample collected: yes Arterial cord blood sample collected: no  Placenta delivered Schultz, intact, with 3 VC located approx 4-5 cm from placental edge.  Placenta to L&D. Uterine tone firm, bleeding scant  2nd degree laceration identified.  Anesthesia: epidural Repair: 2-0 Vicryl CT and 4-0 Vicryl SH EBL (mL): 300 (200 prior to birth and 100 post placenta Complications: estimated 200 ml blood loss with rupture of cervical polyps while pushing.  TXA administered prior to delivery to conserve blood products APGAR: APGAR (1 MIN): 9   APGAR (5 MINS): 9   APGAR (10 MINS):   Mom to postpartum.  Baby to Couplet care / Skin to Skin. Baby name undecided Circumcision yes  Roma Schanz DNP, CNM 03/09/2023, 12:55 PM

## 2022-11-02 ENCOUNTER — Other Ambulatory Visit: Payer: Self-pay | Admitting: Obstetrics and Gynecology

## 2022-11-02 DIAGNOSIS — O09511 Supervision of elderly primigravida, first trimester: Secondary | ICD-10-CM

## 2022-11-30 ENCOUNTER — Other Ambulatory Visit: Payer: Self-pay

## 2022-12-01 ENCOUNTER — Ambulatory Visit: Payer: BC Managed Care – PPO

## 2022-12-01 ENCOUNTER — Other Ambulatory Visit: Payer: Self-pay

## 2022-12-01 ENCOUNTER — Ambulatory Visit: Payer: BC Managed Care – PPO | Attending: Obstetrics and Gynecology

## 2022-12-01 VITALS — BP 115/69 | HR 95

## 2022-12-01 DIAGNOSIS — O43192 Other malformation of placenta, second trimester: Secondary | ICD-10-CM | POA: Diagnosis not present

## 2022-12-01 DIAGNOSIS — O3442 Maternal care for other abnormalities of cervix, second trimester: Secondary | ICD-10-CM

## 2022-12-01 DIAGNOSIS — O09512 Supervision of elderly primigravida, second trimester: Secondary | ICD-10-CM

## 2022-12-01 DIAGNOSIS — N841 Polyp of cervix uteri: Secondary | ICD-10-CM | POA: Diagnosis present

## 2022-12-01 DIAGNOSIS — O09522 Supervision of elderly multigravida, second trimester: Secondary | ICD-10-CM | POA: Diagnosis present

## 2022-12-01 DIAGNOSIS — Z3A24 24 weeks gestation of pregnancy: Secondary | ICD-10-CM | POA: Diagnosis not present

## 2022-12-01 DIAGNOSIS — O09511 Supervision of elderly primigravida, first trimester: Secondary | ICD-10-CM | POA: Diagnosis not present

## 2022-12-01 DIAGNOSIS — Z3689 Encounter for other specified antenatal screening: Secondary | ICD-10-CM

## 2022-12-07 ENCOUNTER — Inpatient Hospital Stay (HOSPITAL_COMMUNITY)
Admission: AD | Admit: 2022-12-07 | Discharge: 2022-12-07 | Disposition: A | Discharge status: Home / Self Care | Payer: BC Managed Care – PPO | Attending: Obstetrics and Gynecology | Admitting: Obstetrics and Gynecology

## 2022-12-07 ENCOUNTER — Encounter (HOSPITAL_COMMUNITY): Payer: Self-pay | Admitting: Obstetrics and Gynecology

## 2022-12-07 DIAGNOSIS — Z3A25 25 weeks gestation of pregnancy: Secondary | ICD-10-CM | POA: Diagnosis not present

## 2022-12-07 DIAGNOSIS — O09522 Supervision of elderly multigravida, second trimester: Secondary | ICD-10-CM | POA: Diagnosis not present

## 2022-12-07 DIAGNOSIS — Z8719 Personal history of other diseases of the digestive system: Secondary | ICD-10-CM

## 2022-12-07 DIAGNOSIS — Z79899 Other long term (current) drug therapy: Secondary | ICD-10-CM | POA: Diagnosis not present

## 2022-12-07 DIAGNOSIS — K5900 Constipation, unspecified: Secondary | ICD-10-CM | POA: Diagnosis not present

## 2022-12-07 DIAGNOSIS — O4692 Antepartum hemorrhage, unspecified, second trimester: Secondary | ICD-10-CM | POA: Insufficient documentation

## 2022-12-07 DIAGNOSIS — O99612 Diseases of the digestive system complicating pregnancy, second trimester: Secondary | ICD-10-CM | POA: Diagnosis not present

## 2022-12-07 HISTORY — DX: Unspecified abnormal cytological findings in specimens from vagina: R87.629

## 2022-12-07 HISTORY — DX: Headache, unspecified: R51.9

## 2022-12-07 HISTORY — DX: Supraventricular tachycardia, unspecified: I47.10

## 2022-12-07 MED ORDER — DOCUSATE SODIUM 250 MG PO CAPS: 250.0000 mg | ORAL_CAPSULE | Freq: Every day | ORAL | 1 refills | Status: DC

## 2022-12-07 NOTE — MAU Note (Signed)
.  Gloria Christensen is a 37 y.o. at [redacted]w[redacted]d here in MAU reporting: Had some vaginal/anal bleeding when wiping at 1:30 pm and again about 1 hr ago. Pt reports she has anal fishers and cervical polyps that have bled in the past but it was a little more blood than usual. After one wipe no more bleeding but second wipe still and some blood. Denies any pain or cramping and reports good fetal movement.  LMP:  Onset of complaint: 130pm Pain score: 0 Vitals:   12/07/22 1737  BP: 128/66  Pulse: 96  Resp: 18  Temp: 98.7 F (37.1 C)     FHT:148 Lab orders placed from triage:

## 2022-12-07 NOTE — Discharge Instructions (Signed)

## 2022-12-07 NOTE — MAU Provider Note (Signed)
History     CSN: [id number]  Arrival date and time: 12/07/22 1713   Event Date/Time   First Provider Initiated Contact with Patient 12/07/22 1808      Chief Complaint  Patient presents with   Vaginal Bleeding   37 y.o. G2P0010 @25$ .5 wks presenting with bleeding. She is unsure if it was vaginal or rectal. Reports having more bleeding than usual after a BM this afternoon. 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 Reports having hard painful stools. States she uses Activia for this. Denies abdominal pain, cramping, or ctx.     OB History     Gravida  2   Para      Term      Preterm      AB  1   Living         SAB  1   IAB      Ectopic      Multiple      Live Births           Obstetric Comments  MAB/cytotec 2023         Past Medical History:  Diagnosis Date   Allergy    Anemia    Bipolar disorder, unspecified ([place])    Headache    SVT (supraventricular tachycardia)    f/u with card 12/28/22   Vaginal Pap smear, abnormal    biopsy, ok since    Past Surgical History:  Procedure Laterality Date   LASIK  2011   WISDOM TOOTH EXTRACTION      Family History  Problem Relation Age of Onset   Asthma Mother    Diabetes Father    Heart disease Paternal Grandfather    Colon cancer Other        unknown family member   Hypertension Neg Hx    Stroke Neg Hx     Social History   Tobacco Use   Smoking status: Never   Smokeless tobacco: Never  Vaping Use   Vaping Use: Never used  Substance Use Topics   Alcohol use: Never    Alcohol/week: 0.0 standard drinks of alcohol   Drug use: Never    Allergies:  Allergies  Allergen Reactions   Ceclor [Cefaclor] Hives   Elemental Sulfur Hives   Ferrous Sulfate Nausea And Vomiting    Medications Prior to Admission  Medication Sig Dispense Refill Last Dose   cetirizine (ZYRTEC) 10 MG tablet Take 10 mg by mouth daily.   [id number]   folic acid (FOLVITE) A999333 MCG tablet Take 1 tablet every day by  oral route as directed for 30 days.   12/07/2022   lamoTRIgine (LAMICTAL) 100 MG tablet Take 100 mg by mouth every evening.    12/06/2022   prenatal vitamin w/FE, FA (PRENATAL 1 + 1) 27-1 MG TABS tablet Take 1 tablet by mouth daily at 12 noon.   12/07/2022    Review of Systems  Gastrointestinal:  Positive for anal bleeding and constipation. Negative for abdominal pain.  Genitourinary:  Positive for vaginal bleeding.   Physical Exam   Blood pressure 128/66, pulse 96, temperature 98.7 F (37.1 C), resp. rate 18, height 5' 6"$  (1.676 m), weight 82.1 kg, last menstrual period 06/10/2022.  Physical Exam Vitals and nursing note reviewed. Exam conducted with a chaperone present.  Constitutional:      General: She is not in acute distress.    Appearance: Normal appearance.  HENT:     Head: Normocephalic and atraumatic.  Cardiovascular:  Rate and Rhythm: Normal rate.  Pulmonary:     Effort: Pulmonary effort is normal. No respiratory distress.  Genitourinary:    Cervix: No cervical bleeding.     Rectum: No tenderness, anal fissure or external hemorrhoid.     Comments: External: no lesions or erythema Speculum: vagina rugated, pink, moist, thin white discharge, no blood or trace Cervix visually closed, multiple polyps          Musculoskeletal:        General: Normal range of motion.     Cervical back: Normal range of motion.  Skin:    General: Skin is warm and dry.  Neurological:     General: No focal deficit present.     Mental Status: She is alert and oriented to person, place, and time.  Psychiatric:        Mood and Affect: Mood normal.        Behavior: Behavior normal.   EFM: 140 bpm, mod variability, + accels, no decels Toco: none  No results found for this or any previous visit (from the past 24 hour(s)).  MAU Course  Procedures  MDM No signs of bleeding. Pt reassured. Discussed measures to soften stool and improve constipation. Stable for discharge  home.  Assessment and Plan   1. [redacted] weeks gestation of pregnancy   2. History of anal fissures   3. Constipation, unspecified constipation type    Discharge home Follow up at [place] [facility] as scheduled Rx Colace  Allergies as of 12/07/2022       Reactions   Ceclor [cefaclor] Hives   Elemental Sulfur Hives   Ferrous Sulfate Nausea And Vomiting        Medication List     TAKE these medications    cetirizine 10 MG tablet Commonly known as: ZYRTEC Take 10 mg by mouth daily.   docusate sodium 250 MG capsule Commonly known as: COLACE Take 1 capsule (250 mg total) by mouth daily.   folic acid A999333 MCG tablet Commonly known as: FOLVITE Take 1 tablet every day by oral route as directed for 30 days.   lamoTRIgine 100 MG tablet Commonly known as: LAMICTAL Take 100 mg by mouth every evening.   prenatal vitamin w/FE, FA 27-1 MG Tabs tablet Take 1 tablet by mouth daily at 12 noon.         Julianne Handler, CNM 12/07/2022, 6:19 PM

## 2023-01-05 ENCOUNTER — Other Ambulatory Visit: Payer: Self-pay

## 2023-01-05 ENCOUNTER — Ambulatory Visit: Payer: BC Managed Care – PPO | Attending: Obstetrics and Gynecology

## 2023-01-05 DIAGNOSIS — M545 Low back pain, unspecified: Secondary | ICD-10-CM | POA: Diagnosis not present

## 2023-01-05 DIAGNOSIS — M6281 Muscle weakness (generalized): Secondary | ICD-10-CM

## 2023-01-05 DIAGNOSIS — R262 Difficulty in walking, not elsewhere classified: Secondary | ICD-10-CM | POA: Insufficient documentation

## 2023-01-05 DIAGNOSIS — O26893 Other specified pregnancy related conditions, third trimester: Secondary | ICD-10-CM | POA: Diagnosis not present

## 2023-01-05 DIAGNOSIS — Z3A00 Weeks of gestation of pregnancy not specified: Secondary | ICD-10-CM | POA: Insufficient documentation

## 2023-01-05 DIAGNOSIS — R252 Cramp and spasm: Secondary | ICD-10-CM

## 2023-01-05 DIAGNOSIS — O26899 Other specified pregnancy related conditions, unspecified trimester: Secondary | ICD-10-CM | POA: Diagnosis present

## 2023-01-05 DIAGNOSIS — M5459 Other low back pain: Secondary | ICD-10-CM | POA: Diagnosis not present

## 2023-01-05 NOTE — Therapy (Signed)
OUTPATIENT PHYSICAL THERAPY THORACOLUMBAR EVALUATION   Patient Name: Gloria Christensen MRN: [id number] DOB:08-Nov-1985, 37 y.o., female Today's Date: 01/05/2023  END OF SESSION:  PT End of Session - 01/05/23 1027     Visit Number 1    Authorization Type BCBS    PT Start Time 1018    PT Stop Time 1105    PT Time Calculation (min) 47 min    Activity Tolerance Patient tolerated treatment well    Behavior During Therapy WFL for tasks assessed/performed             Past Medical History:  Diagnosis Date   Allergy    Anemia    Bipolar disorder, unspecified ([place])    Headache    SVT (supraventricular tachycardia)    f/u with card 12/28/22   Vaginal Pap smear, abnormal    biopsy, ok since   Past Surgical History:  Procedure Laterality Date   LASIK  2011   WISDOM TOOTH EXTRACTION     Patient Active Problem List   Diagnosis Date Noted   Acute pyelonephritis 12/04/2019   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   Other malaise and fatigue 12/03/2012   Irregular menstrual bleeding 12/03/2012    PCP: Crawford Givens, MD   REFERRING PROVIDER: Donnel Saxon, CNM  REFERRING DIAG: [id number] (ICD-10-CM) - Other specified pregnancy related conditions, unspecified trimester O26.893,M54.50 (ICD-10-CM) - Low back pain during pregnancy, third trimester  Rationale for Evaluation and Treatment: Rehabilitation  THERAPY DIAG:  Other low back pain - Plan: PT plan of care cert/re-cert  Muscle weakness (generalized) - Plan: PT plan of care cert/re-cert  Cramp and spasm - Plan: PT plan of care cert/re-cert  Difficulty in walking, not elsewhere classified - Plan: PT plan of care cert/re-cert  ONSET DATE: [id number]  SUBJECTIVE:                                                                                                                                                                                           SUBJECTIVE STATEMENT: 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  She is complaining of low back right hip and right knee pain.  She denies any of these symptoms prior to pregnancy.  She denies any radicular symptoms.  She hopes to work through her pregnancy and not have to go on bed rest.    PERTINENT HISTORY:  na  PAIN:  Are you having pain?  Yes,   PRECAUTIONS: Other: pregnancy  WEIGHT BEARING RESTRICTIONS: No  FALLS:  Has patient fallen in last 6 months? No  LIVING ENVIRONMENT: Lives with: lives with their spouse  Lives in: House/apartment  OCCUPATION: elementary school teacher  PLOF: Independent, Independent with basic ADLs, Independent with household mobility without device, Independent with community mobility without device, Independent with homemaking with ambulation, Independent with gait, and Independent with transfers  PATIENT GOALS: She hopes to work through her pregnancy and not have to go on bed rest.    NEXT MD VISIT: prn  OBJECTIVE:   DIAGNOSTIC FINDINGS:  na  PATIENT SURVEYS:  FOTO time constraints, not entered into FOTO, possibly add next visit or do Oswestry  SCREENING FOR RED FLAGS: Bowel or bladder incontinence: Yes: due to pregnancy Spinal tumors: No Cauda equina syndrome: No Compression fracture: No Abdominal aneurysm: No  COGNITION: Overall cognitive status: Within functional limits for tasks assessed     SENSATION: WFL  MUSCLE LENGTH: Hamstrings: Right 50 deg; Left 50 deg Thomas test: Right pos; Left pos   POSTURE: increased lumbar lordosis  PALPATION: Tender to palpation at right S.I. joint, right greater trochanter, right IT band and right lateral knee  LUMBAR ROM:   WFL  LOWER EXTREMITY ROM:     All WFL  LOWER EXTREMITY MMT:    Generally 4 to 4+/5  LUMBAR SPECIAL TESTS:  Straight leg raise test: Negative  FUNCTIONAL TESTS:  5 times sit to stand: 14.23 sec Timed up and go (TUG): 9.45 sec  GAIT: Distance walked: 30 feet Assistive  device utilized: None Level of assistance: Complete Independence Comments: slow, guarded and antalgic  TODAY'S TREATMENT:                                                                                                                              DATE: 01/04/22 Initial eval completed and initiated HEP    PATIENT EDUCATION:  Education details: Initiated HEP Person educated: Patient Education method: Consulting civil engineer, Media planner, Verbal cues, and Handouts Education comprehension: verbalized understanding, returned demonstration, and verbal cues required  HOME EXERCISE PROGRAM: Access Code: [email or web address] URL: [email or web address] Date: 01/05/2023 Prepared by: Candyce Churn  Exercises - Supine ITB Stretch with Strap  - 1 x daily - 7 x weekly - 1 sets - 3 reps - 20-30 sec hold - Standing Hamstring Stretch on Chair  - 1 x daily - 7 x weekly - 1 sets - 3 reps - 20-30 sec hold - Standing Quad Stretch with Table and Chair Support  - 1 x daily - 7 x weekly - 1 sets - 3 reps - 20-30 sec hold - Seated Piriformis Stretch with Trunk Bend  - 1 x daily - 7 x weekly - 1 sets - 3 reps - 20-30 sec hold  ASSESSMENT:  CLINICAL IMPRESSION: Patient is a 37 y.o. female who was seen today for physical therapy evaluation and treatment for low back, right hip and right knee pain during pregnancy. She presents with painful lumbar ROM, tenderness to palpation and overall decreased function and elevated pain.  She would respond well to LE flexibility, core strengthening and modalities for pain  relief.    OBJECTIVE IMPAIRMENTS: difficulty walking, decreased strength, increased fascial restrictions, increased muscle spasms, impaired flexibility, and pain.   ACTIVITY LIMITATIONS: carrying, lifting, bending, sitting, standing, squatting, sleeping, stairs, transfers, bed mobility, bathing, dressing, and hygiene/grooming  PARTICIPATION LIMITATIONS: meal prep, cleaning, laundry, interpersonal relationship,  driving, shopping, community activity, occupation, yard work, and church  PERSONAL FACTORS: Fitness and 1-2 comorbidities: pregnancy  are also affecting patient's functional outcome.   REHAB POTENTIAL: Good  CLINICAL DECISION MAKING: Stable/uncomplicated  EVALUATION COMPLEXITY: Low   GOALS: Goals reviewed with patient? Yes  SHORT TERM GOALS: Target date: 02/02/2023   Pain report to be no greater than 4/10  Baseline: Goal status: INITIAL  2.  Patient will be independent with initial HEP  Baseline:  Goal status: INITIAL    LONG TERM GOALS: Target date: 03/02/2023   Patient to report pain no greater than 2/10  Baseline:  Goal status: INITIAL  2.  Patient to be independent with advanced HEP  Baseline:  Goal status: INITIAL  3.  Patient to be able to sleep through the night  Baseline:  Goal status: INITIAL  4.  Patient to report 85% improvement in overall symptoms Baseline:  Goal status: INITIAL  5.  Patient to be able to continue working through the end of her pregnancy Baseline:  Goal status: INITIAL    PLAN:  PT FREQUENCY: 1-2x/week  PT DURATION: 8 weeks  PLANNED INTERVENTIONS: Therapeutic exercises, Therapeutic activity, Neuromuscular re-education, Balance training, Gait training, Patient/Family education, Self Care, Joint mobilization, Stair training, Aquatic Therapy, Dry Needling, Spinal mobilization, Cryotherapy, Moist heat, Compression bandaging, Splintting, Taping, Manual therapy, and Re-evaluation.  PLAN FOR NEXT SESSION: Review HEP, progress hip and pelvic mobility exercises   Isabel Caprice, PT 01/05/2023, 4:12 PM  [facility] [address], [place], [place] [address] Phone # [phone] Fax [phone]

## 2023-01-12 ENCOUNTER — Encounter: Payer: Self-pay | Admitting: Rehabilitative and Restorative Service Providers"

## 2023-01-12 ENCOUNTER — Ambulatory Visit: Payer: BC Managed Care – PPO | Admitting: Rehabilitative and Restorative Service Providers"

## 2023-01-12 DIAGNOSIS — R262 Difficulty in walking, not elsewhere classified: Secondary | ICD-10-CM

## 2023-01-12 DIAGNOSIS — M5459 Other low back pain: Secondary | ICD-10-CM

## 2023-01-12 DIAGNOSIS — O26893 Other specified pregnancy related conditions, third trimester: Secondary | ICD-10-CM | POA: Diagnosis not present

## 2023-01-12 DIAGNOSIS — R252 Cramp and spasm: Secondary | ICD-10-CM

## 2023-01-12 DIAGNOSIS — M6281 Muscle weakness (generalized): Secondary | ICD-10-CM

## 2023-01-12 NOTE — Therapy (Signed)
OUTPATIENT PHYSICAL THERAPY TREATMENT NOTE   Patient Name: Gloria Christensen MRN: [id number] DOB:1986-03-18, 37 y.o., female Today's Date: 01/12/2023  END OF SESSION:  PT End of Session - 01/12/23 1059     Visit Number 2    Authorization Type BCBS    PT Start Time 1058    PT Stop Time 1136    PT Time Calculation (min) 38 min    Activity Tolerance Patient tolerated treatment well    Behavior During Therapy WFL for tasks assessed/performed             Past Medical History:  Diagnosis Date   Allergy    Anemia    Bipolar disorder, unspecified ([place])    Headache    SVT (supraventricular tachycardia)    f/u with card 12/28/22   Vaginal Pap smear, abnormal    biopsy, ok since   Past Surgical History:  Procedure Laterality Date   LASIK  2011   WISDOM TOOTH EXTRACTION     Patient Active Problem List   Diagnosis Date Noted   Acute pyelonephritis 12/04/2019   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   Other malaise and fatigue 12/03/2012   Irregular menstrual bleeding 12/03/2012    PCP: Crawford Givens, MD   REFERRING PROVIDER: Donnel Saxon, CNM  REFERRING DIAG: [phone] (ICD-10-CM) - Other specified pregnancy related conditions, unspecified trimester O26.893,M54.50 (ICD-10-CM) - Low back pain during pregnancy, third trimester  Rationale for Evaluation and Treatment: Rehabilitation  THERAPY DIAG:  Other low back pain  Muscle weakness (generalized)  Cramp and spasm  Difficulty in walking, not elsewhere classified  ONSET DATE: 12/29/2022  SUBJECTIVE:                                                                                                                                                                                           SUBJECTIVE STATEMENT: Pt has found that her pain sometimes varies based upon how she sleeps.  States today, she is feeling better than yesterday.  PERTINENT HISTORY:  na  PAIN:  Are you having  pain?  Yes, 2-6/10   PRECAUTIONS: Other: pregnancy EDD of 03/17/2023  WEIGHT BEARING RESTRICTIONS: No  FALLS:  Has patient fallen in last 6 months? No  LIVING ENVIRONMENT: Lives with: lives with their spouse Lives in: House/apartment  OCCUPATION: elementary school teacher  PLOF: Independent, Independent with basic ADLs, Independent with household mobility without device, Independent with community mobility without device, Independent with homemaking with ambulation, Independent with gait, and Independent with transfers  PATIENT GOALS: She hopes to work through her pregnancy and not have to go on bed rest.  NEXT MD VISIT: routine OB appointments  OBJECTIVE:   DIAGNOSTIC FINDINGS:  na  PATIENT SURVEYS:   01/12/2023:  Modified Oswestry 23 / 50 = 46.0 %   SCREENING FOR RED FLAGS: Bowel or bladder incontinence: Yes: due to pregnancy Spinal tumors: No Cauda equina syndrome: No Compression fracture: No Abdominal aneurysm: No  COGNITION: Overall cognitive status: Within functional limits for tasks assessed     SENSATION: WFL  MUSCLE LENGTH: Hamstrings: Right 50 deg; Left 50 deg Thomas test: Right pos; Left pos   POSTURE: increased lumbar lordosis  PALPATION: Tender to palpation at right S.I. joint, right greater trochanter, right IT band and right lateral knee  LUMBAR ROM:   WFL  LOWER EXTREMITY ROM:     All WFL  LOWER EXTREMITY MMT:    Generally 4 to 4+/5  LUMBAR SPECIAL TESTS:  Straight leg raise test: Negative  FUNCTIONAL TESTS:  Eval: 5 times sit to stand: 14.23 sec Timed up and go (TUG): 9.45 sec  GAIT: Distance walked: 30 feet Assistive device utilized: None Level of assistance: Complete Independence Comments: slow, guarded and antalgic  TODAY'S TREATMENT:                                                                                                                               DATE: 01/12/2023 Nustep level 4 x6 min with PT present to  discuss status Modified Oswestry Seated piriformis stretch 2x20 sec bilat Standing hamstring stretch at stairs 2x20 sec bilat Standing quad stretch with chair 2x20 sec bilat Supine IT band stretch with strap 2x20 sec bilat Supine posterior pelvic tilt x10 Lower trunk rotation x10 Supine bent knee fallouts x10 Supine TA contraction while pushing hands through thighs 2x10 Sidelying clamshells 2x10 bilat    DATE: 01/04/2022 Initial eval completed and initiated HEP    PATIENT EDUCATION:  Education details: Initiated HEP Person educated: Patient Education method: Consulting civil engineer, Media planner, Verbal cues, and Handouts Education comprehension: verbalized understanding, returned demonstration, and verbal cues required  HOME EXERCISE PROGRAM: Access Code: [email or web address] URL: [email or web address] Date: 01/12/2023 Prepared by: Shelby Dubin Dejohn Ibarra  Exercises - Supine ITB Stretch with Strap  - 1 x daily - 7 x weekly - 1 sets - 3 reps - 20-30 sec hold - Standing Hamstring Stretch on Chair  - 1 x daily - 7 x weekly - 1 sets - 3 reps - 20-30 sec hold - Standing Quad Stretch with Table and Chair Support  - 1 x daily - 7 x weekly - 1 sets - 3 reps - 20-30 sec hold - Seated Piriformis Stretch with Trunk Bend  - 1 x daily - 7 x weekly - 1 sets - 3 reps - 20-30 sec hold - Seated Pelvic Tilt  - 1 x daily - 7 x weekly - 2 sets - 10 reps  ASSESSMENT:  CLINICAL IMPRESSION: Gloria Christensen presents to first skilled PT session following initial evaluation.  Patient states that she is has been doing her  HEP, but admits that she has not been as compliant with supine IT band stretch as the others.  Patient required some cuing during exercise to improve posture and positioning.  Patient did report muscle fatigue and stated that sidelying clamshells were "intense" and she felt like she had gotten a workout in.  Pt verbalizes understanding to add seated pelvic tilts into her HEP, especially when she has been sitting  long hours during teaching or meetings.  OBJECTIVE IMPAIRMENTS: difficulty walking, decreased strength, increased fascial restrictions, increased muscle spasms, impaired flexibility, and pain.   ACTIVITY LIMITATIONS: carrying, lifting, bending, sitting, standing, squatting, sleeping, stairs, transfers, bed mobility, bathing, dressing, and hygiene/grooming  PARTICIPATION LIMITATIONS: meal prep, cleaning, laundry, interpersonal relationship, driving, shopping, community activity, occupation, yard work, and church  PERSONAL FACTORS: Fitness and 1-2 comorbidities: pregnancy  are also affecting patient's functional outcome.   REHAB POTENTIAL: Good  CLINICAL DECISION MAKING: Stable/uncomplicated  EVALUATION COMPLEXITY: Low   GOALS: Goals reviewed with patient? Yes  SHORT TERM GOALS: Target date: 02/02/2023   Pain report to be no greater than 4/10  Baseline: Goal status: IN PROGRESS  2.  Patient will be independent with initial HEP  Baseline:  Goal status: IN PROGRESS    LONG TERM GOALS: Target date: 03/02/2023   Patient to report pain no greater than 2/10  Baseline:  Goal status: INITIAL  2.  Patient to be independent with advanced HEP  Baseline:  Goal status: INITIAL  3.  Patient to be able to sleep through the night  Baseline:  Goal status: INITIAL  4.  Patient to report 85% improvement in overall symptoms Baseline:  Goal status: INITIAL  5.  Patient to be able to continue working through the end of her pregnancy Baseline:  Goal status: INITIAL    PLAN:  PT FREQUENCY: 1-2x/week  PT DURATION: 8 weeks  PLANNED INTERVENTIONS: Therapeutic exercises, Therapeutic activity, Neuromuscular re-education, Balance training, Gait training, Patient/Family education, Self Care, Joint mobilization, Stair training, Aquatic Therapy, Dry Needling, Spinal mobilization, Cryotherapy, Moist heat, Compression bandaging, Splintting, Taping, Manual therapy, and Re-evaluation.  PLAN  FOR NEXT SESSION: Review HEP, progress hip and pelvic mobility exercises   Kimba Lottes, PT 01/12/2023, 11:53 AM  [facility] [address], [place], [place] [address] Phone # [phone] Fax [phone]

## 2023-01-16 ENCOUNTER — Ambulatory Visit: Payer: BC Managed Care – PPO | Admitting: Physical Therapy

## 2023-01-16 DIAGNOSIS — M5459 Other low back pain: Secondary | ICD-10-CM

## 2023-01-16 DIAGNOSIS — O26893 Other specified pregnancy related conditions, third trimester: Secondary | ICD-10-CM | POA: Diagnosis not present

## 2023-01-16 DIAGNOSIS — M6281 Muscle weakness (generalized): Secondary | ICD-10-CM

## 2023-01-16 DIAGNOSIS — R252 Cramp and spasm: Secondary | ICD-10-CM

## 2023-01-16 NOTE — Patient Instructions (Signed)

## 2023-01-16 NOTE — Therapy (Signed)
OUTPATIENT PHYSICAL THERAPY TREATMENT NOTE   Patient Name: Gloria Christensen MRN: [id number] DOB:17-Jan-1986, 37 y.o., female Today's Date: 01/16/2023  END OF SESSION:  PT End of Session - 01/16/23 1613     Visit Number 3    Date for PT Re-Evaluation 03/02/23    Authorization Type BCBS    PT Start Time 1615    PT Stop Time 1655    PT Time Calculation (min) 40 min    Activity Tolerance Patient tolerated treatment well             Past Medical History:  Diagnosis Date   Allergy    Anemia    Bipolar disorder, unspecified ([place])    Headache    SVT (supraventricular tachycardia)    f/u with card 12/28/22   Vaginal Pap smear, abnormal    biopsy, ok since   Past Surgical History:  Procedure Laterality Date   LASIK  2011   WISDOM TOOTH EXTRACTION     Patient Active Problem List   Diagnosis Date Noted   Acute pyelonephritis 12/04/2019   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   Other malaise and fatigue 12/03/2012   Irregular menstrual bleeding 12/03/2012    PCP: Crawford Givens, MD   REFERRING PROVIDER: Donnel Saxon, CNM  REFERRING DIAG: [phone] (ICD-10-CM) - Other specified pregnancy related conditions, unspecified trimester O26.893,M54.50 (ICD-10-CM) - Low back pain during pregnancy, third trimester  Rationale for Evaluation and Treatment: Rehabilitation  THERAPY DIAG:  Other low back pain  Muscle weakness (generalized)  Cramp and spasm  ONSET DATE: 12/29/2022  SUBJECTIVE:                                                                                                                                                                                           SUBJECTIVE STATEMENT: My leg hurts.  It depends on how I sleep on it. Along ITB, white heat.  Prenatal massage helped for 1 week;  worse as the day goes on.  I took a muscle relaxer last night.   PERTINENT HISTORY:  Na Due in 2 months  PAIN:  Are you having pain?   Yes, 2-6/10   PRECAUTIONS: Other: pregnancy EDD of 03/17/2023  WEIGHT BEARING RESTRICTIONS: No  FALLS:  Has patient fallen in last 6 months? No  LIVING ENVIRONMENT: Lives with: lives with their spouse Lives in: House/apartment  OCCUPATION: elementary school teacher  PLOF: Independent, Independent with basic ADLs, Independent with household mobility without device, Independent with community mobility without device, Independent with homemaking with ambulation, Independent with gait, and Independent with transfers  PATIENT GOALS: She hopes to  work through her pregnancy and not have to go on bed rest.    NEXT MD VISIT: routine OB appointments  OBJECTIVE:   DIAGNOSTIC FINDINGS:  na  PATIENT SURVEYS:   01/12/2023:  Modified Oswestry 23 / 50 = 46.0 %   SCREENING FOR RED FLAGS: Bowel or bladder incontinence: Yes: due to pregnancy Spinal tumors: No Cauda equina syndrome: No Compression fracture: No Abdominal aneurysm: No  COGNITION: Overall cognitive status: Within functional limits for tasks assessed     SENSATION: WFL  MUSCLE LENGTH: Hamstrings: Right 50 deg; Left 50 deg Thomas test: Right pos; Left pos   POSTURE: increased lumbar lordosis  PALPATION: Tender to palpation at right S.I. joint, right greater trochanter, right IT band and right lateral knee  LUMBAR ROM:   WFL  LOWER EXTREMITY ROM:     All WFL  LOWER EXTREMITY MMT:    Generally 4 to 4+/5  LUMBAR SPECIAL TESTS:  Straight leg raise test: Negative  FUNCTIONAL TESTS:  Eval: 5 times sit to stand: 14.23 sec Timed up and go (TUG): 9.45 sec  GAIT: Distance walked: 30 feet Assistive device utilized: None Level of assistance: Complete Independence Comments: slow, guarded and antalgic  TODAY'S TREATMENT:                                                                                                                              DATE: 01/16/2023 Nustep level 4 x6 min with PT present to discuss  status Discussion of use of belly band (no higher strap secondary to heartburn) Discussion of benefit of water ex to offload compression Manual therapy: soft tissue mobilization;  KT taping right anterior thigh I strip Trigger Point Dry-Needling  Treatment instructions: Expect mild to moderate muscle soreness. S/S of pneumothorax if dry needled over a lung field, and to seek immediate medical attention should they occur. Patient verbalized understanding of these instructions and education.  Patient Consent Given: Yes Education handout provided: Yes Muscles treated: right lateral and medial quads, ITB long sitting (table propped up, bolster behind knees) Electrical stimulation performed: No Parameters: N/A Treatment response/outcome: improved soft tissue mobility      DATE: 01/12/2023 Nustep level 4 x6 min with PT present to discuss status Modified Oswestry Seated piriformis stretch 2x20 sec bilat Standing hamstring stretch at stairs 2x20 sec bilat Standing quad stretch with chair 2x20 sec bilat Supine IT band stretch with strap 2x20 sec bilat Supine posterior pelvic tilt x10 Lower trunk rotation x10 Supine bent knee fallouts x10 Supine TA contraction while pushing hands through thighs 2x10 Sidelying clamshells 2x10 bilat    DATE: 01/04/2022 Initial eval completed and initiated HEP    PATIENT EDUCATION:  Education details: Initiated HEP; DN info Person educated: Patient Education method: Consulting civil engineer, Demonstration, Verbal cues, and Handouts Education comprehension: verbalized understanding, returned demonstration, and verbal cues required  HOME EXERCISE PROGRAM: Access Code: [email or web address] URL: [email or web address] Date: 01/12/2023 Prepared by: Juel Burrow  Exercises - Supine ITB Stretch  with Strap  - 1 x daily - 7 x weekly - 1 sets - 3 reps - 20-30 sec hold - Standing Hamstring Stretch on Chair  - 1 x daily - 7 x weekly - 1 sets - 3 reps - 20-30 sec hold -  Standing Quad Stretch with Table and Chair Support  - 1 x daily - 7 x weekly - 1 sets - 3 reps - 20-30 sec hold - Seated Piriformis Stretch with Trunk Bend  - 1 x daily - 7 x weekly - 1 sets - 3 reps - 20-30 sec hold - Seated Pelvic Tilt  - 1 x daily - 7 x weekly - 2 sets - 10 reps  ASSESSMENT:  CLINICAL IMPRESSION: Very high symptom irritability therefore patient is highly receptive to trying DN and manual techniques.  Good initial response to treatment with decreased sensitivity noted and improved soft tissue mobility.  The patient was encouraged in regular performance of HEP post DN including soft tissue lengthening and strengthening exercises to enhance long term benefit.     OBJECTIVE IMPAIRMENTS: difficulty walking, decreased strength, increased fascial restrictions, increased muscle spasms, impaired flexibility, and pain.   ACTIVITY LIMITATIONS: carrying, lifting, bending, sitting, standing, squatting, sleeping, stairs, transfers, bed mobility, bathing, dressing, and hygiene/grooming  PARTICIPATION LIMITATIONS: meal prep, cleaning, laundry, interpersonal relationship, driving, shopping, community activity, occupation, yard work, and church  PERSONAL FACTORS: Fitness and 1-2 comorbidities: pregnancy  are also affecting patient's functional outcome.   REHAB POTENTIAL: Good  CLINICAL DECISION MAKING: Stable/uncomplicated  EVALUATION COMPLEXITY: Low   GOALS: Goals reviewed with patient? Yes  SHORT TERM GOALS: Target date: 02/02/2023   Pain report to be no greater than 4/10  Baseline: Goal status: IN PROGRESS  2.  Patient will be independent with initial HEP  Baseline:  Goal status: IN PROGRESS    LONG TERM GOALS: Target date: 03/02/2023   Patient to report pain no greater than 2/10  Baseline:  Goal status: INITIAL  2.  Patient to be independent with advanced HEP  Baseline:  Goal status: INITIAL  3.  Patient to be able to sleep through the night  Baseline:  Goal  status: INITIAL  4.  Patient to report 85% improvement in overall symptoms Baseline:  Goal status: INITIAL  5.  Patient to be able to continue working through the end of her pregnancy Baseline:  Goal status: INITIAL    PLAN:  PT FREQUENCY: 1-2x/week  PT DURATION: 8 weeks  PLANNED INTERVENTIONS: Therapeutic exercises, Therapeutic activity, Neuromuscular re-education, Balance training, Gait training, Patient/Family education, Self Care, Joint mobilization, Stair training, Aquatic Therapy, Dry Needling, Spinal mobilization, Cryotherapy, Moist heat, Compression bandaging, Splintting, Taping, Manual therapy, and Re-evaluation.  PLAN FOR NEXT SESSION: assess response to DN and KT;  try 1/2 kneel neural floss;  Review HEP, progress hip and pelvic mobility exercises  Ruben Im, PT 01/16/23 5:21 PM Phone: [phone] Fax: [facility] [address], [address] [place], [place] [address] Phone # [phone] Fax [phone]

## 2023-01-18 ENCOUNTER — Ambulatory Visit: Payer: BC Managed Care – PPO | Admitting: Physical Therapy

## 2023-01-18 DIAGNOSIS — M5459 Other low back pain: Secondary | ICD-10-CM

## 2023-01-18 DIAGNOSIS — R252 Cramp and spasm: Secondary | ICD-10-CM

## 2023-01-18 DIAGNOSIS — O26893 Other specified pregnancy related conditions, third trimester: Secondary | ICD-10-CM | POA: Diagnosis not present

## 2023-01-18 DIAGNOSIS — M6281 Muscle weakness (generalized): Secondary | ICD-10-CM

## 2023-01-18 NOTE — Therapy (Signed)
OUTPATIENT PHYSICAL THERAPY TREATMENT NOTE   Patient Name: Gloria Christensen MRN: [id number] DOB:12/03/1985, 37 y.o., female Today's Date: 01/16/2023  END OF SESSION:  PT End of Session - 01/16/23 1613     Visit Number 3    Date for PT Re-Evaluation 03/02/23    Authorization Type [employer]BS    PT Start Time 1615    PT Stop Time 1655    PT Time Calculation (min) 40 min    Activity Tolerance Patient tolerated treatment well             Past Medical History:  Diagnosis Date   Allergy    Anemia    Bipolar disorder, unspecified ([place])    Headache    SVT (supraventricular tachycardia)    f/u with card 12/28/22   Vaginal Pap smear, abnormal    biopsy, ok since   Past Surgical History:  Procedure Laterality Date   LASIK  2011   WISDOM TOOTH EXTRACTION     Patient Active Problem List   Diagnosis Date Noted   Acute pyelonephritis 12/04/2019   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   Other malaise and fatigue 12/03/2012   Irregular menstrual bleeding 12/03/2012    PCP: Crawford Givens, MD   REFERRING PROVIDER: Donnel Saxon, CNM  REFERRING DIAG: [phone] (ICD-10-CM) - Other specified pregnancy related conditions, unspecified trimester O26.893,M54.50 (ICD-10-CM) - Low back pain during pregnancy, third trimester  Rationale for Evaluation and Treatment: Rehabilitation  THERAPY DIAG:  Other low back pain  Muscle weakness (generalized)  Cramp and spasm  ONSET DATE: 12/29/2022  SUBJECTIVE:                                                                                                                                                                                           SUBJECTIVE STATEMENT: I think the DN helped.  I went swimming yesterday.  It felt pretty good.  Sleeping is the hardest time.     PERTINENT HISTORY:  Na Due in 2 months  PAIN:  Are you having pain?  Yes, 2-6/10   PRECAUTIONS: Other: pregnancy EDD of  03/17/2023  WEIGHT BEARING RESTRICTIONS: No  FALLS:  Has patient fallen in last 6 months? No  LIVING ENVIRONMENT: Lives with: lives with their spouse Lives in: House/apartment  OCCUPATION: elementary school teacher  PLOF: Independent, Independent with basic ADLs, Independent with household mobility without device, Independent with community mobility without device, Independent with homemaking with ambulation, Independent with gait, and Independent with transfers  PATIENT GOALS: She hopes to work through her pregnancy and not have to go on bed rest.  NEXT MD VISIT: routine OB appointments  OBJECTIVE:   DIAGNOSTIC FINDINGS:  na  PATIENT SURVEYS:   01/12/2023:  Modified Oswestry 23 / 50 = 46.0 %   SCREENING FOR RED FLAGS: Bowel or bladder incontinence: Yes: due to pregnancy Spinal tumors: No Cauda equina syndrome: No Compression fracture: No Abdominal aneurysm: No  COGNITION: Overall cognitive status: Within functional limits for tasks assessed     SENSATION: WFL  MUSCLE LENGTH: Hamstrings: Right 50 deg; Left 50 deg Thomas test: Right pos; Left pos   POSTURE: increased lumbar lordosis  PALPATION: Tender to palpation at right S.I. joint, right greater trochanter, right IT band and right lateral knee  LUMBAR ROM:   WFL  LOWER EXTREMITY ROM:     All WFL  LOWER EXTREMITY MMT:    Generally 4 to 4+/5  LUMBAR SPECIAL TESTS:  Straight leg raise test: Negative  FUNCTIONAL TESTS:  Eval: 5 times sit to stand: 14.23 sec Timed up and go (TUG): 9.45 sec  GAIT: Distance walked: 30 feet Assistive device utilized: None Level of assistance: Complete Independence Comments: slow, guarded and antalgic  TODAY'S TREATMENT:       DATE: 01/16/2023 Discussion of use of belly band (no higher strap secondary to heartburn) Discussed strategies for off loading or opening up right anterior hip Standing lunge forward hip flexor stretch with UE elevation and reach over 1/2  kneel hip flexor stretch with UE elevation and reach over Sidelying neural floss: spinal flexion with right hip extension and hip external rotation, head flexion/extension Discussed sleeping position with folded pillow b/w knees Sidelying clams 15x Quadruped hip extension 10x right/left Discussed seated with hips in external rotation for pain relief                                                                                                                            DATE: 01/16/2023 Nustep level 4 x6 min with PT present to discuss status Discussion of use of belly band (no higher strap secondary to heartburn) Discussion of benefit of water ex to offload compression Manual therapy: soft tissue mobilization;  KT taping right anterior thigh I strip Trigger Point Dry-Needling  Treatment instructions: Expect mild to moderate muscle soreness. S/S of pneumothorax if dry needled over a lung field, and to seek immediate medical attention should they occur. Patient verbalized understanding of these instructions and education.  Patient Consent Given: Yes Education handout provided: Yes Muscles treated: right lateral and medial quads, ITB long sitting (table propped up, bolster behind knees) Electrical stimulation performed: No Parameters: N/A Treatment response/outcome: improved soft tissue mobility      DATE: 01/12/2023 Nustep level 4 x6 min with PT present to discuss status Modified Oswestry Seated piriformis stretch 2x20 sec bilat Standing hamstring stretch at stairs 2x20 sec bilat Standing quad stretch with chair 2x20 sec bilat Supine IT band stretch with strap 2x20 sec bilat Supine posterior pelvic tilt x10 Lower trunk rotation x10 Supine bent knee fallouts x10  Supine TA contraction while pushing hands through thighs 2x10 Sidelying clamshells 2x10 bilat    DATE: 01/04/2022 Initial eval completed and initiated HEP    PATIENT EDUCATION:  Education details: Initiated HEP; DN  info Person educated: Patient Education method: Explanation, Demonstration, Verbal cues, and Handouts Education comprehension: verbalized understanding, returned demonstration, and verbal cues required  HOME EXERCISE PROGRAM: Access Code: [email or web address] URL: [email or web address] Date: 01/12/2023 Prepared by: Shelby Dubin Menke  Exercises - Supine ITB Stretch with Strap  - 1 x daily - 7 x weekly - 1 sets - 3 reps - 20-30 sec hold - Standing Hamstring Stretch on Chair  - 1 x daily - 7 x weekly - 1 sets - 3 reps - 20-30 sec hold - Standing Quad Stretch with Table and Chair Support  - 1 x daily - 7 x weekly - 1 sets - 3 reps - 20-30 sec hold - Seated Piriformis Stretch with Trunk Bend  - 1 x daily - 7 x weekly - 1 sets - 3 reps - 20-30 sec hold - Seated Pelvic Tilt  - 1 x daily - 7 x weekly - 2 sets - 10 reps  ASSESSMENT:  CLINICAL IMPRESSION: Treatment focus on strategies for pain relief in standing, lying and seated including positioning and neural flossing.  Also discussed gluteal strengthening for postural support.  Good response with all.  Pain level much lower than last visit with patient reporting a positive response to DN.    OBJECTIVE IMPAIRMENTS: difficulty walking, decreased strength, increased fascial restrictions, increased muscle spasms, impaired flexibility, and pain.   ACTIVITY LIMITATIONS: carrying, lifting, bending, sitting, standing, squatting, sleeping, stairs, transfers, bed mobility, bathing, dressing, and hygiene/grooming  PARTICIPATION LIMITATIONS: meal prep, cleaning, laundry, interpersonal relationship, driving, shopping, community activity, occupation, yard work, and church  PERSONAL FACTORS: Fitness and 1-2 comorbidities: pregnancy  are also affecting patient's functional outcome.   REHAB POTENTIAL: Good  CLINICAL DECISION MAKING: Stable/uncomplicated  EVALUATION COMPLEXITY: Low   GOALS: Goals reviewed with patient? Yes  SHORT TERM GOALS: Target  date: 02/02/2023   Pain report to be no greater than 4/10  Baseline: Goal status: IN PROGRESS  2.  Patient will be independent with initial HEP  Baseline:  Goal status: IN PROGRESS    LONG TERM GOALS: Target date: 03/02/2023   Patient to report pain no greater than 2/10  Baseline:  Goal status: INITIAL  2.  Patient to be independent with advanced HEP  Baseline:  Goal status: INITIAL  3.  Patient to be able to sleep through the night  Baseline:  Goal status: INITIAL  4.  Patient to report 85% improvement in overall symptoms Baseline:  Goal status: INITIAL  5.  Patient to be able to continue working through the end of her pregnancy Baseline:  Goal status: INITIAL    PLAN:  PT FREQUENCY: 1-2x/week  PT DURATION: 8 weeks  PLANNED INTERVENTIONS: Therapeutic exercises, Therapeutic activity, Neuromuscular re-education, Balance training, Gait training, Patient/Family education, Self Care, Joint mobilization, Stair training, Aquatic Therapy, Dry Needling, Spinal mobilization, Cryotherapy, Moist heat, Compression bandaging, Splintting, Taping, Manual therapy, and Re-evaluation.  PLAN FOR NEXT SESSION:  DN as needed; 1/2 kneel neural floss;  Review HEP, progress hip and pelvic mobility exercises  Ruben Im, PT 01/18/23 10:37 AM Phone: [phone] Fax: [facility] [address] [place] [address] [place], [place] [address] Phone # [phone] Fax [phone]

## 2023-01-24 ENCOUNTER — Encounter: Payer: BC Managed Care – PPO | Admitting: Rehabilitative and Restorative Service Providers"

## 2023-01-26 ENCOUNTER — Ambulatory Visit: Payer: BC Managed Care – PPO | Admitting: *Deleted

## 2023-01-26 ENCOUNTER — Ambulatory Visit: Payer: BC Managed Care – PPO | Attending: Obstetrics and Gynecology | Admitting: Physical Therapy

## 2023-01-26 ENCOUNTER — Ambulatory Visit: Payer: BC Managed Care – PPO | Attending: Obstetrics and Gynecology

## 2023-01-26 VITALS — BP 117/75 | HR 81

## 2023-01-26 DIAGNOSIS — N841 Polyp of cervix uteri: Secondary | ICD-10-CM

## 2023-01-26 DIAGNOSIS — R262 Difficulty in walking, not elsewhere classified: Secondary | ICD-10-CM | POA: Diagnosis present

## 2023-01-26 DIAGNOSIS — O43193 Other malformation of placenta, third trimester: Secondary | ICD-10-CM

## 2023-01-26 DIAGNOSIS — O09523 Supervision of elderly multigravida, third trimester: Secondary | ICD-10-CM | POA: Diagnosis present

## 2023-01-26 DIAGNOSIS — O3443 Maternal care for other abnormalities of cervix, third trimester: Secondary | ICD-10-CM | POA: Diagnosis not present

## 2023-01-26 DIAGNOSIS — M5459 Other low back pain: Secondary | ICD-10-CM

## 2023-01-26 DIAGNOSIS — O43192 Other malformation of placenta, second trimester: Secondary | ICD-10-CM | POA: Insufficient documentation

## 2023-01-26 DIAGNOSIS — Z3A32 32 weeks gestation of pregnancy: Secondary | ICD-10-CM

## 2023-01-26 DIAGNOSIS — M6281 Muscle weakness (generalized): Secondary | ICD-10-CM

## 2023-01-26 DIAGNOSIS — R252 Cramp and spasm: Secondary | ICD-10-CM | POA: Diagnosis present

## 2023-01-26 DIAGNOSIS — O09513 Supervision of elderly primigravida, third trimester: Secondary | ICD-10-CM | POA: Diagnosis not present

## 2023-01-26 NOTE — Therapy (Signed)
OUTPATIENT PHYSICAL THERAPY TREATMENT NOTE   Patient Name: Gloria Christensen MRN: [id number] DOB:Sep 29, 1986, 37 y.o., female Today's Date: 01/26/2023  END OF SESSION:  PT End of Session - 01/26/23 0758     Visit Number 5    Date for PT Re-Evaluation 03/02/23    Authorization Type BCBS    PT Start Time 0800    PT Stop Time 0840    PT Time Calculation (min) 40 min    Activity Tolerance Patient tolerated treatment well             Past Medical History:  Diagnosis Date   Allergy    Anemia    Bipolar disorder, unspecified (HCC)    Headache    SVT (supraventricular tachycardia)    f/u with card 12/28/22   Vaginal Pap smear, abnormal    biopsy, ok since   Past Surgical History:  Procedure Laterality Date   LASIK  2011   WISDOM TOOTH EXTRACTION     Patient Active Problem List   Diagnosis Date Noted   Acute pyelonephritis 12/04/2019   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   Other malaise and fatigue 12/03/2012   Irregular menstrual bleeding 12/03/2012    PCP: Jaymes Graff, MD   REFERRING PROVIDER: Nigel Bridgeman, CNM  REFERRING DIAG: [phone] (ICD-10-CM) - Other specified pregnancy related conditions, unspecified trimester O26.893,M54.50 (ICD-10-CM) - Low back pain during pregnancy, third trimester  Rationale for Evaluation and Treatment: Rehabilitation  THERAPY DIAG:  Other low back pain  Muscle weakness (generalized)  Cramp and spasm  ONSET DATE: 12/29/2022  SUBJECTIVE:                                                                                                                                                                                           SUBJECTIVE STATEMENT: I went swimming again.  I'm sore the next day.  My leg feels better.  There is more space in my leg.  My hip is weak when I walk.  It's not waking me up at night now.  I do have to be careful not to sleep/nap on that side.  I do sleep with a pillow  b/w my knees.    PERTINENT HISTORY:  Na Due in 2 months  PAIN: no  PRECAUTIONS: Other: pregnancy EDD of 03/17/2023  WEIGHT BEARING RESTRICTIONS: No  FALLS:  Has patient fallen in last 6 months? No  LIVING ENVIRONMENT: Lives with: lives with their spouse Lives in: House/apartment  OCCUPATION: elementary school teacher  PLOF: Independent, Independent with basic ADLs, Independent with household mobility without device, Independent with community mobility without device,  Independent with homemaking with ambulation, Independent with gait, and Independent with transfers  PATIENT GOALS: She hopes to work through her pregnancy and not have to go on bed rest.    NEXT MD VISIT: routine OB appointments  OBJECTIVE:   DIAGNOSTIC FINDINGS:  na  PATIENT SURVEYS:   01/12/2023:  Modified Oswestry 23 / 50 = 46.0 %   SCREENING FOR RED FLAGS: Bowel or bladder incontinence: Yes: due to pregnancy Spinal tumors: No Cauda equina syndrome: No Compression fracture: No Abdominal aneurysm: No  COGNITION: Overall cognitive status: Within functional limits for tasks assessed     SENSATION: WFL  MUSCLE LENGTH: Hamstrings: Right 50 deg; Left 50 deg Thomas test: Right pos; Left pos   POSTURE: increased lumbar lordosis  PALPATION: Tender to palpation at right S.I. joint, right greater trochanter, right IT band and right lateral knee  LUMBAR ROM:   WFL  LOWER EXTREMITY ROM:     All WFL  LOWER EXTREMITY MMT:    Generally 4 to 4+/5  LUMBAR SPECIAL TESTS:  Straight leg raise test: Negative  FUNCTIONAL TESTS:  Eval: 5 times sit to stand: 14.23 sec Timed up and go (TUG): 9.45 sec  GAIT: Distance walked: 30 feet Assistive device utilized: None Level of assistance: Complete Independence Comments: slow, guarded and antalgic  TODAY'S TREATMENT:       DATE: 01/25/2023 Review of HEP: 1/2 kneel hip flexor stretch on foam pad with UE elevation and reach over Sidelying neural  floss: spinal flexion with right hip extension and hip external rotation Sitting on green ball with hip extension  Review of Quadruped hip extension  Birth prep ex: Supine propped up on wedge bil internal rotation 10x Sidelying reverse clams 15x    DATE: 01/16/2023 Discussion of use of belly band (no higher strap secondary to heartburn) Discussed strategies for off loading or opening up right anterior hip Standing lunge forward hip flexor stretch with UE elevation and reach over 1/2 kneel hip flexor stretch with UE elevation and reach over Sidelying neural floss: spinal flexion with right hip extension and hip external rotation, head flexion/extension Discussed sleeping position with folded pillow b/w knees Sidelying clams 15x Quadruped hip extension 10x right/left Discussed seated with hips in external rotation for pain relief                                                                                                                            DATE: 01/16/2023 Nustep level 4 x6 min with PT present to discuss status Discussion of use of belly band (no higher strap secondary to heartburn) Discussion of benefit of water ex to offload compression Manual therapy: soft tissue mobilization;  KT taping right anterior thigh I strip Trigger Point Dry-Needling  Treatment instructions: Expect mild to moderate muscle soreness. S/S of pneumothorax if dry needled over a lung field, and to seek immediate medical attention should they occur. Patient verbalized understanding of these instructions and education.  Patient Consent Given:  Yes Education handout provided: Yes Muscles treated: right lateral and medial quads, ITB long sitting (table propped up, bolster behind knees) Electrical stimulation performed: No Parameters: N/A Treatment response/outcome: improved soft tissue mobility      PATIENT EDUCATION:  Education details: Initiated HEP; DN info Person educated: Patient Education  method: Programmer, multimediaxplanation, Facilities managerDemonstration, Verbal cues, and Handouts Education comprehension: verbalized understanding, returned demonstration, and verbal cues required  HOME EXERCISE PROGRAM: Access Code: [email or web address] URL: [email or web address] Date: 01/26/2023 Prepared by: Lavinia SharpsStacy Anderson Middlebrooks  Exercises - Supine ITB Stretch with Strap  - 1 x daily - 7 x weekly - 1 sets - 3 reps - 20-30 sec hold - Standing Hamstring Stretch on Chair  - 1 x daily - 7 x weekly - 1 sets - 3 reps - 20-30 sec hold - Standing Quad Stretch with Table and Chair Support  - 1 x daily - 7 x weekly - 1 sets - 3 reps - 20-30 sec hold - Seated Piriformis Stretch with Trunk Bend  - 1 x daily - 7 x weekly - 1 sets - 3 reps - 20-30 sec hold - Seated Pelvic Tilt  - 1 x daily - 7 x weekly - 2 sets - 10 reps - Sidelying Femoral Nerve Glide - Top Leg  - 1 x daily - 7 x weekly - 1 sets - 10 reps - Half Kneeling Hip Flexor Stretch with Tri-Planar Reach (Mirrored)  - 1 x daily - 7 x weekly - 1 sets - 10 reps - 5 hold - Seated Hip Flexor Stretch on Swiss Ball  - 1 x daily - 7 x weekly - 1 sets - 3-5 reps - Seated Hip Flexor Stretch  - 1 x daily - 7 x weekly - 1 sets - 3-5 reps - Clamshell  - 1 x daily - 7 x weekly - 1 sets - 10 reps - Beginner Front Arm Support  - 1 x daily - 7 x weekly - 1 sets - 10 reps - Sidelying Reverse Clamshell  - 1 x daily - 7 x weekly - 1 sets - 10 reps - Supine Bilateral Hip Internal Rotation Stretch  - 1 x daily - 7 x weekly - 1 sets - 10 reps  ASSESSMENT:  CLINICAL IMPRESSION: Significant improvements reported in pain and function.   Sleeping much better and more tolerant to sitting and standing.  She is highly compliant with her HEP and is also swimming and walking more.  Updated and progressed HEP to include hip internal rotation for birth preparation without production of symptoms.    OBJECTIVE IMPAIRMENTS: difficulty walking, decreased strength, increased fascial restrictions, increased muscle  spasms, impaired flexibility, and pain.   ACTIVITY LIMITATIONS: carrying, lifting, bending, sitting, standing, squatting, sleeping, stairs, transfers, bed mobility, bathing, dressing, and hygiene/grooming  PARTICIPATION LIMITATIONS: meal prep, cleaning, laundry, interpersonal relationship, driving, shopping, community activity, occupation, yard work, and church  PERSONAL FACTORS: Fitness and 1-2 comorbidities: pregnancy  are also affecting patient's functional outcome.   REHAB POTENTIAL: Good  CLINICAL DECISION MAKING: Stable/uncomplicated  EVALUATION COMPLEXITY: Low   GOALS: Goals reviewed with patient? Yes  SHORT TERM GOALS: Target date: 02/02/2023   Pain report to be no greater than 4/10  Baseline: Goal status: met 4/5  2.  Patient will be independent with initial HEP  Baseline:  Goal status: met 4/5    LONG TERM GOALS: Target date: 03/02/2023   Patient to report pain no greater than 2/10  Baseline:  Goal status: INITIAL  2.  Patient  to be independent with advanced HEP  Baseline:  Goal status: INITIAL  3.  Patient to be able to sleep through the night  Baseline:  Goal status: INITIAL  4.  Patient to report 85% improvement in overall symptoms Baseline:  Goal status: INITIAL  5.  Patient to be able to continue working through the end of her pregnancy Baseline:  Goal status: INITIAL    PLAN:  PT FREQUENCY: 1-2x/week  PT DURATION: 8 weeks  PLANNED INTERVENTIONS: Therapeutic exercises, Therapeutic activity, Neuromuscular re-education, Balance training, Gait training, Patient/Family education, Self Care, Joint mobilization, Stair training, Aquatic Therapy, Dry Needling, Spinal mobilization, Cryotherapy, Moist heat, Compression bandaging, Splintting, Taping, Manual therapy, and Re-evaluation.  PLAN FOR NEXT SESSION:  DN as needed; 1/2 kneel neural floss;  Review HEP, progress hip and pelvic mobility exercises   Lavinia SharpsStacy Lyndzee Kliebert, PT 01/26/23 9:57 AM Phone:  [phone] Fax: [phone]  [facility] [address], [address] [place], [place] [address] Phone # [phone] Fax [phone]

## 2023-01-30 ENCOUNTER — Encounter: Payer: BC Managed Care – PPO | Admitting: Physical Therapy

## 2023-02-02 ENCOUNTER — Ambulatory Visit: Payer: BC Managed Care – PPO | Admitting: Physical Therapy

## 2023-02-02 DIAGNOSIS — M6281 Muscle weakness (generalized): Secondary | ICD-10-CM

## 2023-02-02 DIAGNOSIS — M5459 Other low back pain: Secondary | ICD-10-CM | POA: Diagnosis not present

## 2023-02-02 DIAGNOSIS — R252 Cramp and spasm: Secondary | ICD-10-CM

## 2023-02-02 NOTE — Therapy (Signed)
OUTPATIENT PHYSICAL THERAPY TREATMENT NOTE   Patient Name: Gloria Christensen MRN: [id number] DOB:[birth year], 37 y.o., female Today's Date: 02/02/2023  END OF SESSION:  PT End of Session - 02/02/23 0846     Visit Number 6    Date for PT Re-Evaluation 03/02/23    Authorization Type [employer]BS    PT Start Time 0846    PT Stop Time 0928    PT Time Calculation (min) 42 min    Activity Tolerance Patient tolerated treatment well             Past Medical History:  Diagnosis Date   Allergy    Anemia    Bipolar disorder, unspecified    Headache    SVT (supraventricular tachycardia)    f/u with card 12/28/22   Vaginal Pap smear, abnormal    biopsy, ok since   Past Surgical History:  Procedure Laterality Date   LASIK  2011   WISDOM TOOTH EXTRACTION     Patient Active Problem List   Diagnosis Date Noted   Acute pyelonephritis 12/04/2019   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   Other malaise and fatigue 12/03/2012   Irregular menstrual bleeding 12/03/2012    PCP: Jaymes Graff, MD   REFERRING PROVIDER: Nigel Bridgeman, CNM  REFERRING DIAG: [phone] (ICD-10-CM) - Other specified pregnancy related conditions, unspecified trimester O26.893,M54.50 (ICD-10-CM) - Low back pain during pregnancy, third trimester  Rationale for Evaluation and Treatment: Rehabilitation  THERAPY DIAG:  Other low back pain  Muscle weakness (generalized)  Cramp and spasm  ONSET DATE: 12/29/2022  SUBJECTIVE:                                                                                                                                                                                           SUBJECTIVE STATEMENT: I had so many meetings so I didn't get to the gym or pool.  Increased right thigh pain and numbness.   Next Friday pregnancy massage.   PERTINENT HISTORY:   Due in mid May  PAIN: no  PRECAUTIONS: Other: pregnancy EDD of 03/17/2023  WEIGHT BEARING  RESTRICTIONS: No  FALLS:  Has patient fallen in last 6 months? No  LIVING ENVIRONMENT: Lives with: lives with their spouse Lives in: House/apartment  OCCUPATION: elementary school teacher  PLOF: Independent, Independent with basic ADLs, Independent with household mobility without device, Independent with community mobility without device, Independent with homemaking with ambulation, Independent with gait, and Independent with transfers  PATIENT GOALS: She hopes to work through her pregnancy and not have to go on bed rest.    NEXT MD VISIT: routine OB  appointments  OBJECTIVE:   DIAGNOSTIC FINDINGS:  na  PATIENT SURVEYS:   01/12/2023:  Modified Oswestry 23 / 50 = 46.0 %   SCREENING FOR RED FLAGS: Bowel or bladder incontinence: Yes: due to pregnancy Spinal tumors: No Cauda equina syndrome: No Compression fracture: No Abdominal aneurysm: No  COGNITION: Overall cognitive status: Within functional limits for tasks assessed     SENSATION: WFL  MUSCLE LENGTH: Hamstrings: Right 50 deg; Left 50 deg Thomas test: Right pos; Left pos   POSTURE: increased lumbar lordosis  PALPATION: Tender to palpation at right S.I. joint, right greater trochanter, right IT band and right lateral knee  LUMBAR ROM:   WFL  LOWER EXTREMITY ROM:     All WFL  LOWER EXTREMITY MMT:    Generally 4 to 4+/5  LUMBAR SPECIAL TESTS:  Straight leg raise test: Negative  FUNCTIONAL TESTS:  Eval: 5 times sit to stand: 14.23 sec Timed up and go (TUG): 9.45 sec  GAIT: Distance walked: 30 feet Assistive device utilized: None Level of assistance: Complete Independence Comments: slow, guarded and antalgic  TODAY'S TREATMENT:       DATE: 02/02/2023 Seated stretch over the peanut roll  Sidelying Peanut forward roll (increased numbness) Open books with leg on peanut roll left sidelying OK, right sidelying increased abdominal pull Foot in chair with trunk rotation stretch 1/2 kneel in doorway  (dec numbness) Manual therapy: soft tissue mobilization;  KT taping right anterior thigh I strip Trigger Point Dry-Needling  Treatment instructions: Expect mild to moderate muscle soreness. S/S of pneumothorax if dry needled over a lung field, and to seek immediate medical attention should they occur. Patient verbalized understanding of these instructions and education.  Patient Consent Given: Yes Education handout provided: Yes Muscles treated: right lateral and medial quads, ITB long sitting (table propped up, bolster behind knees) Electrical stimulation performed: No Parameters: N/A Treatment response/outcome: improved soft tissue mobility  Heat 3 min to right thigh   DATE: 01/25/2023 Review of HEP: 1/2 kneel hip flexor stretch on foam pad with UE elevation and reach over Sidelying neural floss: spinal flexion with right hip extension and hip external rotation Sitting on green ball with hip extension  Review of Quadruped hip extension  Birth prep ex: Supine propped up on wedge bil internal rotation 10x Sidelying reverse clams 15x    DATE: 01/16/2023 Discussion of use of belly band (no higher strap secondary to heartburn) Discussed strategies for off loading or opening up right anterior hip Standing lunge forward hip flexor stretch with UE elevation and reach over 1/2 kneel hip flexor stretch with UE elevation and reach over Sidelying neural floss: spinal flexion with right hip extension and hip external rotation, head flexion/extension Discussed sleeping position with folded pillow b/w knees Sidelying clams 15x Quadruped hip extension 10x right/left Discussed seated with hips in external rotation for pain relief     PATIENT EDUCATION:  Education details: Initiated HEP; DN info Person educated: Patient Education method: Programmer, multimedia, Facilities manager, Verbal cues, and Handouts Education comprehension: verbalized understanding, returned demonstration, and verbal cues  required  HOME EXERCISE PROGRAM: Access Code: [email or web address] URL: [email or web address] Date: 01/26/2023 Prepared by: Lavinia Sharps  Exercises - Supine ITB Stretch with Strap  - 1 x daily - 7 x weekly - 1 sets - 3 reps - 20-30 sec hold - Standing Hamstring Stretch on Chair  - 1 x daily - 7 x weekly - 1 sets - 3 reps - 20-30 sec hold - Standing Quad Stretch with Table and Chair  Support  - 1 x daily - 7 x weekly - 1 sets - 3 reps - 20-30 sec hold - Seated Piriformis Stretch with Trunk Bend  - 1 x daily - 7 x weekly - 1 sets - 3 reps - 20-30 sec hold - Seated Pelvic Tilt  - 1 x daily - 7 x weekly - 2 sets - 10 reps - Sidelying Femoral Nerve Glide - Top Leg  - 1 x daily - 7 x weekly - 1 sets - 10 reps - Half Kneeling Hip Flexor Stretch with Tri-Planar Reach (Mirrored)  - 1 x daily - 7 x weekly - 1 sets - 10 reps - 5 hold - Seated Hip Flexor Stretch on Swiss Ball  - 1 x daily - 7 x weekly - 1 sets - 3-5 reps - Seated Hip Flexor Stretch  - 1 x daily - 7 x weekly - 1 sets - 3-5 reps - Clamshell  - 1 x daily - 7 x weekly - 1 sets - 10 reps - Beginner Front Arm Support  - 1 x daily - 7 x weekly - 1 sets - 10 reps - Sidelying Reverse Clamshell  - 1 x daily - 7 x weekly - 1 sets - 10 reps - Supine Bilateral Hip Internal Rotation Stretch  - 1 x daily - 7 x weekly - 1 sets - 10 reps  ASSESSMENT:  CLINICAL IMPRESSION: Patient more symptomatic today compared to last couple of visits which could be the result of less activity (more work meetings, no yoga or water ex this week).  Half kneeling was most helpful in symptom relief.  Quite sensitive to DN this time but following session patient reports a sensation of the tightness "letting go."  Therapist monitoring response to all interventions and modifying treatment accordingly.    OBJECTIVE IMPAIRMENTS: difficulty walking, decreased strength, increased fascial restrictions, increased muscle spasms, impaired flexibility, and pain.   ACTIVITY  LIMITATIONS: carrying, lifting, bending, sitting, standing, squatting, sleeping, stairs, transfers, bed mobility, bathing, dressing, and hygiene/grooming  PARTICIPATION LIMITATIONS: meal prep, cleaning, laundry, interpersonal relationship, driving, shopping, community activity, occupation, yard work, and church  PERSONAL FACTORS: Fitness and 1-2 comorbidities: pregnancy  are also affecting patient's functional outcome.   REHAB POTENTIAL: Good  CLINICAL DECISION MAKING: Stable/uncomplicated  EVALUATION COMPLEXITY: Low   GOALS: Goals reviewed with patient? Yes  SHORT TERM GOALS: Target date: 02/02/2023   Pain report to be no greater than 4/10  Baseline: Goal status: met 4/5  2.  Patient will be independent with initial HEP  Baseline:  Goal status: met 4/5    LONG TERM GOALS: Target date: 03/02/2023   Patient to report pain no greater than 2/10  Baseline:  Goal status: INITIAL  2.  Patient to be independent with advanced HEP  Baseline:  Goal status: INITIAL  3.  Patient to be able to sleep through the night  Baseline:  Goal status: INITIAL  4.  Patient to report 85% improvement in overall symptoms Baseline:  Goal status: INITIAL  5.  Patient to be able to continue working through the end of her pregnancy Baseline:  Goal status: INITIAL    PLAN:  PT FREQUENCY: 1-2x/week  PT DURATION: 8 weeks  PLANNED INTERVENTIONS: Therapeutic exercises, Therapeutic activity, Neuromuscular re-education, Balance training, Gait training, Patient/Family education, Self Care, Joint mobilization, Stair training, Aquatic Therapy, Dry Needling, Spinal mobilization, Cryotherapy, Moist heat, Compression bandaging, Splintting, Taping, Manual therapy, and Re-evaluation.  PLAN FOR NEXT SESSION:  DN as needed; 1/2  kneel neural floss;  Review HEP, progress hip and pelvic mobility exercises   Lavinia Sharps, PT 02/02/23 10:36 AM Phone: [phone] Fax: [phone]  [facility] [address], [address] [place], [place] [address] Phone # [phone] Fax [phone]

## 2023-02-07 ENCOUNTER — Encounter: Payer: BC Managed Care – PPO | Admitting: Rehabilitative and Restorative Service Providers"

## 2023-02-09 ENCOUNTER — Ambulatory Visit: Payer: BC Managed Care – PPO | Admitting: Physical Therapy

## 2023-02-09 DIAGNOSIS — M5459 Other low back pain: Secondary | ICD-10-CM | POA: Diagnosis not present

## 2023-02-09 DIAGNOSIS — R252 Cramp and spasm: Secondary | ICD-10-CM

## 2023-02-09 DIAGNOSIS — M6281 Muscle weakness (generalized): Secondary | ICD-10-CM

## 2023-02-09 DIAGNOSIS — R262 Difficulty in walking, not elsewhere classified: Secondary | ICD-10-CM

## 2023-02-09 NOTE — Therapy (Signed)
OUTPATIENT PHYSICAL THERAPY TREATMENT NOTE   Patient Name: Gloria Christensen MRN: [id number] DOB:[birth year], 37 y.o., female Today's Date: 02/09/2023  END OF SESSION:  PT End of Session - 02/09/23 0846     Visit Number 7    Date for PT Re-Evaluation 03/02/23    Authorization Type BCBS    PT Start Time 0845    PT Stop Time 0927    PT Time Calculation (min) 42 min    Activity Tolerance Patient tolerated treatment well             Past Medical History:  Diagnosis Date   Allergy    Anemia    Bipolar disorder, unspecified    Headache    SVT (supraventricular tachycardia)    f/u with card 12/28/22   Vaginal Pap smear, abnormal    biopsy, ok since   Past Surgical History:  Procedure Laterality Date   LASIK  2011   WISDOM TOOTH EXTRACTION     Patient Active Problem List   Diagnosis Date Noted   Acute pyelonephritis 12/04/2019   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   Other malaise and fatigue 12/03/2012   Irregular menstrual bleeding 12/03/2012    PCP: Jaymes Graff, MD   REFERRING PROVIDER: Nigel Bridgeman, CNM  REFERRING DIAG: [phone] (ICD-10-CM) - Other specified pregnancy related conditions, unspecified trimester O26.893,M54.50 (ICD-10-CM) - Low back pain during pregnancy, third trimester  Rationale for Evaluation and Treatment: Rehabilitation  THERAPY DIAG:  Other low back pain  Muscle weakness (generalized)  Cramp and spasm  Difficulty in walking, not elsewhere classified  ONSET DATE: 12/29/2022  SUBJECTIVE:                                                                                                                                                                                           SUBJECTIVE STATEMENT: I did better this week.  Went to the pool.  Doing the stretches.  My yoga class is getting hard b/c I can't lie on my back mostly.  Blood work showed low muscle tone.  The DN really helped.  It felt so  much better.   PERTINENT HISTORY:   Due in mid May  PAIN: no  PRECAUTIONS: Other: pregnancy EDD of 03/17/2023  WEIGHT BEARING RESTRICTIONS: No  FALLS:  Has patient fallen in last 6 months? No  LIVING ENVIRONMENT: Lives with: lives with their spouse Lives in: House/apartment  OCCUPATION: elementary school teacher  PLOF: Independent, Independent with basic ADLs, Independent with household mobility without device, Independent with community mobility without device, Independent with homemaking with ambulation, Independent with gait, and Independent with  transfers  PATIENT GOALS: She hopes to work through her pregnancy and not have to go on bed rest.    NEXT MD VISIT: routine OB appointments  OBJECTIVE:   DIAGNOSTIC FINDINGS:  na  PATIENT SURVEYS:   01/12/2023:  Modified Oswestry 23 / 50 = 46.0 %   SCREENING FOR RED FLAGS: Bowel or bladder incontinence: Yes: due to pregnancy Spinal tumors: No Cauda equina syndrome: No Compression fracture: No Abdominal aneurysm: No  COGNITION: Overall cognitive status: Within functional limits for tasks assessed     SENSATION: WFL  MUSCLE LENGTH: Hamstrings: Right 50 deg; Left 50 deg Thomas test: Right pos; Left pos   POSTURE: increased lumbar lordosis  PALPATION: Tender to palpation at right S.I. joint, right greater trochanter, right IT band and right lateral knee  LUMBAR ROM:   WFL  LOWER EXTREMITY ROM:     All WFL  LOWER EXTREMITY MMT:    Generally 4 to 4+/5  LUMBAR SPECIAL TESTS:  Straight leg raise test: Negative  FUNCTIONAL TESTS:  Eval: 5 times sit to stand: 14.23 sec Timed up and go (TUG): 9.45 sec  GAIT: Distance walked: 30 feet Assistive device utilized: None Level of assistance: Complete Independence Comments: slow, guarded and antalgic  TODAY'S TREATMENT:  DATE: 02/09/2023 Discussed [facility] of [facility] Manual therapy: soft tissue  mobilization to right quads Trigger Point Dry-Needling  Treatment instructions: Expect mild to moderate muscle soreness. S/S of pneumothorax if dry needled over a lung field, and to seek immediate medical attention should they occur. Patient verbalized understanding of these instructions and education.  Patient Consent Given: Yes Education handout provided: Yes Muscles treated: right distal quads anterior Electrical stimulation performed: No Parameters: N/A Treatment response/outcome: improved soft tissue mobility            DATE: 02/02/2023 Seated stretch over the peanut roll  Sidelying Peanut forward roll (increased numbness) Open books with leg on peanut roll left sidelying OK, right sidelying increased abdominal pull Foot in chair with trunk rotation stretch 1/2 kneel in doorway (dec numbness) Manual therapy: soft tissue mobilization;  KT taping right anterior thigh I strip Trigger Point Dry-Needling  Treatment instructions: Expect mild to moderate muscle soreness. S/S of pneumothorax if dry needled over a lung field, and to seek immediate medical attention should they occur. Patient verbalized understanding of these instructions and education.  Patient Consent Given: Yes Education handout provided: Yes Muscles treated: right lateral and medial quads, ITB long sitting (table propped up, bolster behind knees) Electrical stimulation performed: No Parameters: N/A Treatment response/outcome: improved soft tissue mobility  Heat 3 min to right thigh   DATE: 01/25/2023 Review of HEP: 1/2 kneel hip flexor stretch on foam pad with UE elevation and reach over Sidelying neural floss: spinal flexion with right hip extension and hip external rotation Sitting on green ball with hip extension  Review of Quadruped hip extension  Birth prep ex: Supine propped up on wedge bil internal rotation 10x Sidelying reverse clams 15x     PATIENT EDUCATION:  Education details: Initiated HEP; DN  info Person educated: Patient Education method: Programmer, multimedia, Facilities manager, Verbal cues, and Handouts Education comprehension: verbalized understanding, returned demonstration, and verbal cues required  HOME EXERCISE PROGRAM: Access Code: [email or web address] URL: [email or web address] Date: 01/26/2023 Prepared by: Lavinia Sharps  Exercises - Supine ITB Stretch with Strap  - 1 x daily - 7 x weekly - 1 sets - 3 reps - 20-30 sec hold -  Standing Hamstring Stretch on Chair  - 1 x daily - 7 x weekly - 1 sets - 3 reps - 20-30 sec hold - Standing Quad Stretch with Table and Chair Support  - 1 x daily - 7 x weekly - 1 sets - 3 reps - 20-30 sec hold - Seated Piriformis Stretch with Trunk Bend  - 1 x daily - 7 x weekly - 1 sets - 3 reps - 20-30 sec hold - Seated Pelvic Tilt  - 1 x daily - 7 x weekly - 2 sets - 10 reps - Sidelying Femoral Nerve Glide - Top Leg  - 1 x daily - 7 x weekly - 1 sets - 10 reps - Half Kneeling Hip Flexor Stretch with Tri-Planar Reach (Mirrored)  - 1 x daily - 7 x weekly - 1 sets - 10 reps - 5 hold - Seated Hip Flexor Stretch on Swiss Ball  - 1 x daily - 7 x weekly - 1 sets - 3-5 reps - Seated Hip Flexor Stretch  - 1 x daily - 7 x weekly - 1 sets - 3-5 reps - Clamshell  - 1 x daily - 7 x weekly - 1 sets - 10 reps - Beginner Front Arm Support  - 1 x daily - 7 x weekly - 1 sets - 10 reps - Sidelying Reverse Clamshell  - 1 x daily - 7 x weekly - 1 sets - 10 reps - Supine Bilateral Hip Internal Rotation Stretch  - 1 x daily - 7 x weekly - 1 sets - 10 reps  ASSESSMENT:  CLINICAL IMPRESSION: The patient benefits significantly from dry needling and manual therapy to stimulate underlying myofascial trigger points and muscular tissue for management of neuromusculoskeletal pain and address movement impairments.  Much improved soft tissue mobility and decreased tender point size and number following treatment session.   The patient was encouraged in regular performance of HEP post  DN including soft tissue lengthening and strengthening exercises to enhance long term benefit.    OBJECTIVE IMPAIRMENTS: difficulty walking, decreased strength, increased fascial restrictions, increased muscle spasms, impaired flexibility, and pain.   ACTIVITY LIMITATIONS: carrying, lifting, bending, sitting, standing, squatting, sleeping, stairs, transfers, bed mobility, bathing, dressing, and hygiene/grooming  PARTICIPATION LIMITATIONS: meal prep, cleaning, laundry, interpersonal relationship, driving, shopping, community activity, occupation, yard work, and church  PERSONAL FACTORS: Fitness and 1-2 comorbidities: pregnancy  are also affecting patient's functional outcome.   REHAB POTENTIAL: Good  CLINICAL DECISION MAKING: Stable/uncomplicated  EVALUATION COMPLEXITY: Low   GOALS: Goals reviewed with patient? Yes  SHORT TERM GOALS: Target date: 02/02/2023   Pain report to be no greater than 4/10  Baseline: Goal status: met 4/5  2.  Patient will be independent with initial HEP  Baseline:  Goal status: met 4/5    LONG TERM GOALS: Target date: 03/02/2023   Patient to report pain no greater than 2/10  Baseline:  Goal status: INITIAL  2.  Patient to be independent with advanced HEP  Baseline:  Goal status: INITIAL  3.  Patient to be able to sleep through the night  Baseline:  Goal status: INITIAL  4.  Patient to report 85% improvement in overall symptoms Baseline:  Goal status: INITIAL  5.  Patient to be able to continue working through the end of her pregnancy Baseline:  Goal status: INITIAL    PLAN:  PT FREQUENCY: 1-2x/week  PT DURATION: 8 weeks  PLANNED INTERVENTIONS: Therapeutic exercises, Therapeutic activity, Neuromuscular re-education, Balance training, Gait training, Patient/Family  education, Self Care, Joint mobilization, Stair training, Aquatic Therapy, Dry Needling, Spinal mobilization, Cryotherapy, Moist heat, Compression bandaging, Splintting,  Taping, Manual therapy, and Re-evaluation.  PLAN FOR NEXT SESSION:  DN as needed; 1/2 kneel neural floss;  Review HEP, progress hip and pelvic mobility exercises   Lavinia Sharps, PT 02/09/23 12:11 PM Phone: [phone] Fax: [phone]  [facility] [address] [place], [place] [address] Phone # [phone] Fax [phone]

## 2023-02-12 ENCOUNTER — Encounter: Payer: BC Managed Care – PPO | Admitting: Rehabilitative and Restorative Service Providers"

## 2023-02-15 ENCOUNTER — Ambulatory Visit: Payer: BC Managed Care – PPO | Admitting: Physical Therapy

## 2023-02-15 DIAGNOSIS — M5459 Other low back pain: Secondary | ICD-10-CM

## 2023-02-15 DIAGNOSIS — R252 Cramp and spasm: Secondary | ICD-10-CM

## 2023-02-15 DIAGNOSIS — R262 Difficulty in walking, not elsewhere classified: Secondary | ICD-10-CM

## 2023-02-15 DIAGNOSIS — M6281 Muscle weakness (generalized): Secondary | ICD-10-CM

## 2023-02-15 NOTE — Therapy (Signed)
OUTPATIENT PHYSICAL THERAPY TREATMENT NOTE   Patient Name: Gloria Christensen MRN: [id number] DOB:04/08/1986, 37 y.o., female Today's Date: 02/15/2023  END OF SESSION:  PT End of Session - 02/15/23 0757     Visit Number 8    Date for PT Re-Evaluation 03/02/23    Authorization Type [employer]    PT Start Time 0800    PT Stop Time 0840    PT Time Calculation (min) 40 min    Activity Tolerance Patient tolerated treatment well             Past Medical History:  Diagnosis Date   Allergy    Anemia    Bipolar disorder, unspecified    Headache    SVT (supraventricular tachycardia)    f/u with card 12/28/22   Vaginal Pap smear, abnormal    biopsy, ok since   Past Surgical History:  Procedure Laterality Date   LASIK  2011   WISDOM TOOTH EXTRACTION     Patient Active Problem List   Diagnosis Date Noted   Acute pyelonephritis 12/04/2019   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   Other malaise and fatigue 12/03/2012   Irregular menstrual bleeding 12/03/2012    PCP: Jaymes Graff, MD   REFERRING PROVIDER: Nigel Bridgeman, CNM  REFERRING DIAG: [phone] (ICD-10-CM) - Other specified pregnancy related conditions, unspecified trimester O26.893,M54.50 (ICD-10-CM) - Low back pain during pregnancy, third trimester  Rationale for Evaluation and Treatment: Rehabilitation  THERAPY DIAG:  Other low back pain  Muscle weakness (generalized)  Cramp and spasm  Difficulty in walking, not elsewhere classified  ONSET DATE: 12/29/2022  SUBJECTIVE:                                                                                                                                                                                           SUBJECTIVE STATEMENT: Went swimming on Monday.  Doing my ex's.  Tightness in low/hip inguinal area.  Hard to rise from squatting position or from the chair.   This is end of week 35. If I choose to stand then my knee  hurts.  PERTINENT HISTORY:   Due in mid May  PAIN: no  PRECAUTIONS: Other: pregnancy EDD of 03/17/2023  WEIGHT BEARING RESTRICTIONS: No  FALLS:  Has patient fallen in last 6 months? No  LIVING ENVIRONMENT: Lives with: lives with their spouse Lives in: House/apartment  OCCUPATION: elementary school teacher  PLOF: Independent, Independent with basic ADLs, Independent with household mobility without device, Independent with community mobility without device, Independent with homemaking with ambulation, Independent with gait, and Independent with transfers  PATIENT GOALS: She hopes  to work through her pregnancy and not have to go on bed rest.    NEXT MD VISIT: routine OB appointments  OBJECTIVE:   DIAGNOSTIC FINDINGS:  na  PATIENT SURVEYS:   01/12/2023:  Modified Oswestry 23 / 50 = 46.0 %   SCREENING FOR RED FLAGS: Bowel or bladder incontinence: Yes: due to pregnancy Spinal tumors: No Cauda equina syndrome: No Compression fracture: No Abdominal aneurysm: No  COGNITION: Overall cognitive status: Within functional limits for tasks assessed     SENSATION: WFL  MUSCLE LENGTH: Hamstrings: Right 50 deg; Left 50 deg Thomas test: Right pos; Left pos   POSTURE: increased lumbar lordosis  PALPATION: Tender to palpation at right S.I. joint, right greater trochanter, right IT band and right lateral knee  LUMBAR ROM:   WFL  LOWER EXTREMITY ROM:     All WFL  LOWER EXTREMITY MMT:    Generally 4 to 4+/5  LUMBAR SPECIAL TESTS:  Straight leg raise test: Negative  FUNCTIONAL TESTS:  Eval: 5 times sit to stand: 14.23 sec Timed up and go (TUG): 9.45 sec  GAIT: Distance walked: 30 feet Assistive device utilized: None Level of assistance: Complete Independence Comments: slow, guarded and antalgic  TODAY'S TREATMENT:   DATE: 02/15/2023 Sitting on green ball pelvic mobility: forward/back; lateral mobility; circles Sitting on edge of table piriformis stretch 20  sec hold  Quadruped with knees apart: rocking, cat/cow, adductor stretch 5-10x each Discussion of trial of Belly Band again particularly for standing at work Foot in chair with trunk rotation stretch Nu-step L2 (new model) 7 min    DATE: 02/09/2023 Discussed [facility] Review of [facility]P Manual therapy: soft tissue mobilization to right quads Trigger Point Dry-Needling  Treatment instructions: Expect mild to moderate muscle soreness. S/S of pneumothorax if dry needled over a lung field, and to seek immediate medical attention should they occur. Patient verbalized understanding of these instructions and education.  Patient Consent Given: Yes Education handout provided: Yes Muscles treated: right distal quads anterior Electrical stimulation performed: No Parameters: N/A Treatment response/outcome: improved soft tissue mobility            DATE: 02/02/2023 Seated stretch over the peanut roll  Sidelying Peanut forward roll (increased numbness) Open books with leg on peanut roll left sidelying OK, right sidelying increased abdominal pull Foot in chair with trunk rotation stretch 1/2 kneel in doorway (dec numbness) Manual therapy: soft tissue mobilization;  KT taping right anterior thigh I strip Trigger Point Dry-Needling  Treatment instructions: Expect mild to moderate muscle soreness. S/S of pneumothorax if dry needled over a lung field, and to seek immediate medical attention should they occur. Patient verbalized understanding of these instructions and education.  Patient Consent Given: Yes Education handout provided: Yes Muscles treated: right lateral and medial quads, ITB long sitting (table propped up, bolster behind knees) Electrical stimulation performed: No Parameters: N/A Treatment response/outcome: improved soft tissue mobility  Heat 3 min to right thigh    PATIENT EDUCATION:  Education details: Initiated HEP; DN  info Person educated: Patient Education method: Programmer, multimedia, Facilities manager, Verbal cues, and Handouts Education comprehension: verbalized understanding, returned demonstration, and verbal cues required  HOME EXERCISE PROGRAM: Access Code: [email or web address] URL: [email or web address] Date: 01/26/2023 Prepared by: Lavinia Sharps  Exercises - Supine ITB Stretch with Strap  - 1 x daily - 7 x weekly - 1 sets - 3 reps - 20-30 sec hold - Standing Hamstring Stretch on Chair  - 1  x daily - 7 x weekly - 1 sets - 3 reps - 20-30 sec hold - Standing Quad Stretch with Table and Chair Support  - 1 x daily - 7 x weekly - 1 sets - 3 reps - 20-30 sec hold - Seated Piriformis Stretch with Trunk Bend  - 1 x daily - 7 x weekly - 1 sets - 3 reps - 20-30 sec hold - Seated Pelvic Tilt  - 1 x daily - 7 x weekly - 2 sets - 10 reps - Sidelying Femoral Nerve Glide - Top Leg  - 1 x daily - 7 x weekly - 1 sets - 10 reps - Half Kneeling Hip Flexor Stretch with Tri-Planar Reach (Mirrored)  - 1 x daily - 7 x weekly - 1 sets - 10 reps - 5 hold - Seated Hip Flexor Stretch on Swiss Ball  - 1 x daily - 7 x weekly - 1 sets - 3-5 reps - Seated Hip Flexor Stretch  - 1 x daily - 7 x weekly - 1 sets - 3-5 reps - Clamshell  - 1 x daily - 7 x weekly - 1 sets - 10 reps - Beginner Front Arm Support  - 1 x daily - 7 x weekly - 1 sets - 10 reps - Sidelying Reverse Clamshell  - 1 x daily - 7 x weekly - 1 sets - 10 reps - Supine Bilateral Hip Internal Rotation Stretch  - 1 x daily - 7 x weekly - 1 sets - 10 reps  ASSESSMENT:  CLINICAL IMPRESSION: Primary complaint of bilateral anterior hip tightness as she approaches the 36 week mark of pregnancy.  She responds well to light exercise with emphasis on pelvic mobility and hip abduction/extension movements.  Encouraged trying Belly Band again for pelvic support now that her heartburn has improved.  Therapist monitoring response modifying treatment accordingly.     OBJECTIVE  IMPAIRMENTS: difficulty walking, decreased strength, increased fascial restrictions, increased muscle spasms, impaired flexibility, and pain.   ACTIVITY LIMITATIONS: carrying, lifting, bending, sitting, standing, squatting, sleeping, stairs, transfers, bed mobility, bathing, dressing, and hygiene/grooming  PARTICIPATION LIMITATIONS: meal prep, cleaning, laundry, interpersonal relationship, driving, shopping, community activity, occupation, yard work, and church  PERSONAL FACTORS: Fitness and 1-2 comorbidities: pregnancy  are also affecting patient's functional outcome.   REHAB POTENTIAL: Good  CLINICAL DECISION MAKING: Stable/uncomplicated  EVALUATION COMPLEXITY: Low   GOALS: Goals reviewed with patient? Yes  SHORT TERM GOALS: Target date: 02/02/2023   Pain report to be no greater than 4/10  Baseline: Goal status: met 4/5  2.  Patient will be independent with initial HEP  Baseline:  Goal status: met 4/5    LONG TERM GOALS: Target date: 03/02/2023   Patient to report pain no greater than 2/10  Baseline:  Goal status: INITIAL  2.  Patient to be independent with advanced HEP  Baseline:  Goal status: INITIAL  3.  Patient to be able to sleep through the night  Baseline:  Goal status: INITIAL  4.  Patient to report 85% improvement in overall symptoms Baseline:  Goal status: INITIAL  5.  Patient to be able to continue working through the end of her pregnancy Baseline:  Goal status: INITIAL    PLAN:  PT FREQUENCY: 1-2x/week  PT DURATION: 8 weeks  PLANNED INTERVENTIONS: Therapeutic exercises, Therapeutic activity, Neuromuscular re-education, Balance training, Gait training, Patient/Family education, Self Care, Joint mobilization, Stair training, Aquatic Therapy, Dry Needling, Spinal mobilization, Cryotherapy, Moist heat, Compression bandaging, Splintting, Taping, Manual therapy,  and Re-evaluation.  PLAN FOR NEXT SESSION:  DN as needed; 1/2 kneel neural floss;   Review HEP, progress hip and pelvic mobility exercises   Lavinia Sharps, PT 02/15/23 9:52 PM Phone: [phone] Fax: [phone]  [facility] [address], [address] [place], [place] [address] Phone # [phone] Fax [phone]

## 2023-02-21 ENCOUNTER — Encounter: Payer: BC Managed Care – PPO | Admitting: Physical Therapy

## 2023-02-23 ENCOUNTER — Ambulatory Visit: Payer: BC Managed Care – PPO | Attending: Obstetrics and Gynecology | Admitting: Physical Therapy

## 2023-02-23 DIAGNOSIS — M5459 Other low back pain: Secondary | ICD-10-CM | POA: Diagnosis present

## 2023-02-23 DIAGNOSIS — R252 Cramp and spasm: Secondary | ICD-10-CM | POA: Diagnosis present

## 2023-02-23 DIAGNOSIS — M6281 Muscle weakness (generalized): Secondary | ICD-10-CM | POA: Diagnosis present

## 2023-02-23 NOTE — Therapy (Signed)
OUTPATIENT PHYSICAL THERAPY TREATMENT NOTE/DISCHARGE SUMMARY   Patient Name: Gloria Christensen MRN: [id number] DOB:1986-01-22, 37 y.o., female Today's Date: 02/23/2023  END OF SESSION:  PT End of Session - 02/23/23 0756     Visit Number 9    Date for PT Re-Evaluation 03/02/23    Authorization Type BCBS    PT Start Time 0756    PT Stop Time 0835    PT Time Calculation (min) 39 min    Activity Tolerance Patient tolerated treatment well             Past Medical History:  Diagnosis Date   Allergy    Anemia    Bipolar disorder, unspecified (HCC)    Headache    SVT (supraventricular tachycardia)    f/u with card 12/28/22   Vaginal Pap smear, abnormal    biopsy, ok since   Past Surgical History:  Procedure Laterality Date   LASIK  2011   WISDOM TOOTH EXTRACTION     Patient Active Problem List   Diagnosis Date Noted   Acute pyelonephritis 12/04/2019   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   Other malaise and fatigue 12/03/2012   Irregular menstrual bleeding 12/03/2012    PCP: Jaymes Graff, MD   REFERRING PROVIDER: Nigel Bridgeman, CNM  REFERRING DIAG: [phone] (ICD-10-CM) - Other specified pregnancy related conditions, unspecified trimester O26.893,M54.50 (ICD-10-CM) - Low back pain during pregnancy, third trimester  Rationale for Evaluation and Treatment: Rehabilitation  THERAPY DIAG:  Other low back pain  Muscle weakness (generalized)  Cramp and spasm  ONSET DATE: 12/29/2022  SUBJECTIVE:                                                                                                                                                                                           SUBJECTIVE STATEMENT: I'm so much better than I was.  When I came here the first time I couldn't even walk.  80-90% better overall.  Pretty good week.  Not sleeping well (almost [redacted] weeks pregnant).  I've been walking.  Didn't make it to the pool this  week.     PERTINENT HISTORY:   Due in mid May  PAIN: no  PRECAUTIONS: Other: pregnancy EDD of 03/17/2023  WEIGHT BEARING RESTRICTIONS: No  FALLS:  Has patient fallen in last 6 months? No  LIVING ENVIRONMENT: Lives with: lives with their spouse Lives in: House/apartment  OCCUPATION: elementary school teacher  PLOF: Independent, Independent with basic ADLs, Independent with household mobility without device, Independent with community mobility without device, Independent with homemaking with ambulation, Independent with gait, and Independent with transfers  PATIENT GOALS: She hopes to work through her pregnancy and not have to go on bed rest.    NEXT MD VISIT: routine OB appointments  OBJECTIVE:   DIAGNOSTIC FINDINGS:  na  PATIENT SURVEYS:   01/12/2023:  Modified Oswestry 23 / 50 = 46.0 %  5/3: Modified Oswestry 10/40 = 25%  SCREENING FOR RED FLAGS: Bowel or bladder incontinence: Yes: due to pregnancy Spinal tumors: No Cauda equina syndrome: No Compression fracture: No Abdominal aneurysm: No  COGNITION: Overall cognitive status: Within functional limits for tasks assessed     SENSATION: WFL  MUSCLE LENGTH: Hamstrings: Right 50 deg; Left 50 deg Thomas test: Right pos; Left pos   POSTURE: increased lumbar lordosis  PALPATION: Tender to palpation at right S.I. joint, right greater trochanter, right IT band and right lateral knee  LUMBAR ROM:   WFL  LOWER EXTREMITY ROM:     All WFL  LOWER EXTREMITY MMT:    Generally 4 to 4+/5  LUMBAR SPECIAL TESTS:  Straight leg raise test: Negative  FUNCTIONAL TESTS:  Eval: 5 times sit to stand: 14.23 sec Timed up and go (TUG): 9.45 sec  GAIT: Distance walked: 30 feet Assistive device utilized: None Level of assistance: Complete Independence Comments: slow, guarded and antalgic  TODAY'S TREATMENT:  DATE: 02/21/2023 Manual therapy: soft tissue mobilization to right quads Nu-step L1  4 min while discussing  status [place] split squat positioning for distal quad stretch 1/2 kneeling with foot on yoga block for distal quad stretch (feels good per patient) Modified Oswestry Trigger Point Dry-Needling  Treatment instructions: Expect mild to moderate muscle soreness. S/S of pneumothorax if dry needled over a lung field, and to seek immediate medical attention should they occur. Patient verbalized understanding of these instructions and education.  Patient Consent Given: Yes Education handout provided: Yes Muscles treated: right distal quads anterior/lateral Electrical stimulation performed: No Parameters: N/A Treatment response/outcome: improved soft tissue mobility  Heat right thigh 2 min with extra layers         PATIENT EDUCATION:  Education details: Initiated HEP; DN info Person educated: Patient Education method: Programmer, multimedia, Facilities manager, Verbal cues, and Handouts Education comprehension: verbalized understanding, returned demonstration, and verbal cues required  HOME EXERCISE PROGRAM: Access Code: [email or web address] URL: [email or web address] Date: 01/26/2023 Prepared by: Lavinia Sharps  Exercises - Supine ITB Stretch with Strap  - 1 x daily - 7 x weekly - 1 sets - 3 reps - 20-30 sec hold - Standing Hamstring Stretch on Chair  - 1 x daily - 7 x weekly - 1 sets - 3 reps - 20-30 sec hold - Standing Quad Stretch with Table and Chair Support  - 1 x daily - 7 x weekly - 1 sets - 3 reps - 20-30 sec hold - Seated Piriformis Stretch with Trunk Bend  - 1 x daily - 7 x weekly - 1 sets - 3 reps - 20-30 sec hold - Seated Pelvic Tilt  - 1 x daily - 7 x weekly - 2 sets - 10 reps - Sidelying Femoral Nerve Glide - Top Leg  - 1 x daily - 7 x weekly - 1 sets - 10 reps - Half Kneeling Hip Flexor Stretch with Tri-Planar Reach (Mirrored)  - 1 x daily - 7 x weekly - 1 sets - 10 reps - 5 hold - Seated Hip Flexor Stretch on Swiss Ball  - 1 x daily - 7 x weekly - 1 sets - 3-5 reps - Seated Hip  Flexor Stretch  - 1  x daily - 7 x weekly - 1 sets - 3-5 reps - Clamshell  - 1 x daily - 7 x weekly - 1 sets - 10 reps - Beginner Front Arm Support  - 1 x daily - 7 x weekly - 1 sets - 10 reps - Sidelying Reverse Clamshell  - 1 x daily - 7 x weekly - 1 sets - 10 reps - Supine Bilateral Hip Internal Rotation Stretch  - 1 x daily - 7 x weekly - 1 sets - 10 reps  ASSESSMENT:  CLINICAL IMPRESSION:  The patient has met the majority of rehab goals, with noted improvements in pain reduction, outcome score, ROM, strength and functional mobility.  A  HEP has been established.  Recommend discharge from PT at this time.   OBJECTIVE IMPAIRMENTS: difficulty walking, decreased strength, increased fascial restrictions, increased muscle spasms, impaired flexibility, and pain.   ACTIVITY LIMITATIONS: carrying, lifting, bending, sitting, standing, squatting, sleeping, stairs, transfers, bed mobility, bathing, dressing, and hygiene/grooming  PARTICIPATION LIMITATIONS: meal prep, cleaning, laundry, interpersonal relationship, driving, shopping, community activity, occupation, yard work, and church  PERSONAL FACTORS: Fitness and 1-2 comorbidities: pregnancy  are also affecting patient's functional outcome.   REHAB POTENTIAL: Good  CLINICAL DECISION MAKING: Stable/uncomplicated  EVALUATION COMPLEXITY: Low   GOALS: Goals reviewed with patient? Yes  SHORT TERM GOALS: Target date: 02/02/2023   Pain report to be no greater than 4/10  Baseline: Goal status: met 4/5  2.  Patient will be independent with initial HEP  Baseline:  Goal status: met 4/5    LONG TERM GOALS: Target date: 03/02/2023   Patient to report pain no greater than 2/10  Baseline:  Goal status: partially met  2.  Patient to be independent with advanced HEP  Baseline:  Goal status: met 5/3  3.  Patient to be able to sleep through the night  Baseline:  Goal status: affected by pregnancy 4.  Patient to report 85% improvement  in overall symptoms Baseline:  Goal status: met 5/3 5.  Patient to be able to continue working through the end of her pregnancy Baseline:  Goal status: met 5/3    PLAN: PHYSICAL THERAPY DISCHARGE SUMMARY  Visits from Start of Care: 9  Current functional level related to goals / functional outcomes: See clinical impressions above   Remaining deficits: As above   Education / Equipment: HEP   Patient agrees to discharge. Patient goals were met. Patient is being discharged due to meeting the stated rehab goals.   Lavinia Sharps, PT 02/23/23 8:47 AM Phone: [phone] Fax: [phone]  [facility] [address] [place], [place] [address] Phone # [phone] Fax [phone]

## 2023-02-25 LAB — OB RESULTS CONSOLE GBS: GBS: NEGATIVE

## 2023-02-26 ENCOUNTER — Encounter: Payer: BC Managed Care – PPO | Admitting: Rehabilitative and Restorative Service Providers"

## 2023-03-01 ENCOUNTER — Ambulatory Visit: Payer: BC Managed Care – PPO | Admitting: Physical Therapy

## 2023-03-09 ENCOUNTER — Inpatient Hospital Stay (HOSPITAL_COMMUNITY): Payer: BC Managed Care – PPO | Admitting: Anesthesiology

## 2023-03-09 ENCOUNTER — Encounter (HOSPITAL_COMMUNITY): Payer: Self-pay | Admitting: Obstetrics and Gynecology

## 2023-03-09 ENCOUNTER — Inpatient Hospital Stay (HOSPITAL_COMMUNITY)
Admission: AD | Admit: 2023-03-09 | Discharge: 2023-03-11 | DRG: 807 | Disposition: A | Discharge status: Home / Self Care | Payer: BC Managed Care – PPO | Attending: Obstetrics & Gynecology | Admitting: Obstetrics & Gynecology

## 2023-03-09 ENCOUNTER — Other Ambulatory Visit: Payer: Self-pay

## 2023-03-09 DIAGNOSIS — Z23 Encounter for immunization: Secondary | ICD-10-CM

## 2023-03-09 DIAGNOSIS — O09523 Supervision of elderly multigravida, third trimester: Secondary | ICD-10-CM | POA: Insufficient documentation

## 2023-03-09 DIAGNOSIS — O43123 Velamentous insertion of umbilical cord, third trimester: Secondary | ICD-10-CM | POA: Diagnosis present

## 2023-03-09 DIAGNOSIS — Z3A38 38 weeks gestation of pregnancy: Secondary | ICD-10-CM | POA: Diagnosis not present

## 2023-03-09 DIAGNOSIS — O3443 Maternal care for other abnormalities of cervix, third trimester: Secondary | ICD-10-CM | POA: Diagnosis present

## 2023-03-09 DIAGNOSIS — O479 False labor, unspecified: Secondary | ICD-10-CM

## 2023-03-09 DIAGNOSIS — I10 Essential (primary) hypertension: Secondary | ICD-10-CM | POA: Diagnosis not present

## 2023-03-09 DIAGNOSIS — N841 Polyp of cervix uteri: Secondary | ICD-10-CM | POA: Diagnosis present

## 2023-03-09 DIAGNOSIS — R030 Elevated blood-pressure reading, without diagnosis of hypertension: Secondary | ICD-10-CM | POA: Diagnosis present

## 2023-03-09 DIAGNOSIS — O26893 Other specified pregnancy related conditions, third trimester: Principal | ICD-10-CM | POA: Diagnosis present

## 2023-03-09 LAB — CBC
HCT: 38 % (ref 36.0–46.0)
HCT: 32.5 % — ABNORMAL LOW (ref 36.0–46.0)
Hemoglobin: 12.6 g/dL (ref 12.0–15.0)
Hemoglobin: 10.9 g/dL — ABNORMAL LOW (ref 12.0–15.0)
MCH: 28.1 pg (ref 26.0–34.0)
MCH: 29.1 pg (ref 26.0–34.0)
MCHC: 33.2 g/dL (ref 30.0–36.0)
MCHC: 33.5 g/dL (ref 30.0–36.0)
MCV: 84.8 fL (ref 80.0–100.0)
MCV: 86.7 fL (ref 80.0–100.0)
Platelets: 281 10*3/uL (ref 150–400)
Platelets: 228 10*3/uL (ref 150–400)
RBC: 4.48 MIL/uL (ref 3.87–5.11)
RBC: 3.75 MIL/uL — ABNORMAL LOW (ref 3.87–5.11)
RDW: 13 % (ref 11.5–15.5)
RDW: 13.2 % (ref 11.5–15.5)
WBC: 12.8 10*3/uL — ABNORMAL HIGH (ref 4.0–10.5)
WBC: 16.6 10*3/uL — ABNORMAL HIGH (ref 4.0–10.5)
nRBC: 0 % (ref 0.0–0.2)
nRBC: 0 % (ref 0.0–0.2)

## 2023-03-09 LAB — PROTEIN / CREATININE RATIO, URINE
Creatinine, Urine: 16 mg/dL
Total Protein, Urine: <6 mg/dL

## 2023-03-09 LAB — COMPREHENSIVE METABOLIC PANEL
ALT: 18 U/L (ref 0–44)
AST: 20 U/L (ref 15–41)
Albumin: 2.9 g/dL — ABNORMAL LOW (ref 3.5–5.0)
Alkaline Phosphatase: 154 U/L — ABNORMAL HIGH (ref 38–126)
Anion gap: 14 (ref 5–15)
BUN: 11 mg/dL (ref 6–20)
CO2: 18 mmol/L — ABNORMAL LOW (ref 22–32)
Calcium: 9.1 mg/dL (ref 8.9–10.3)
Chloride: 102 mmol/L (ref 98–111)
Creatinine, Ser: 0.66 mg/dL (ref 0.44–1.00)
GFR, Estimated: >60 mL/min (ref >60)
Glucose, Bld: 101 mg/dL — ABNORMAL HIGH (ref 70–99)
Potassium: 3.9 mmol/L (ref 3.5–5.1)
Sodium: 134 mmol/L — ABNORMAL LOW (ref 135–145)
Total Bilirubin: 0.4 mg/dL (ref 0.3–1.2)
Total Protein: 7.3 g/dL (ref 6.5–8.1)

## 2023-03-09 LAB — TYPE AND SCREEN
ABO/RH(D): A POS
Antibody Screen: NEGATIVE

## 2023-03-09 LAB — LACTATE DEHYDROGENASE: LDH: 159 U/L (ref 98–192)

## 2023-03-09 LAB — HIV ANTIBODY (ROUTINE TESTING W REFLEX): HIV Screen 4th Generation wRfx: NONREACTIVE

## 2023-03-09 MED ORDER — COCONUT OIL OIL: 1.0000 | TOPICAL_OIL | Status: DC | PRN

## 2023-03-09 MED ORDER — FENTANYL CITRATE (PF) 100 MCG/2ML IJ SOLN: 50.0000 ug | INTRAMUSCULAR | Status: DC | PRN

## 2023-03-09 MED ORDER — FENTANYL CITRATE (PF) 100 MCG/2ML IJ SOLN
50.0000 ug | Freq: Once | INTRAMUSCULAR | Status: AC
  Administered 2023-03-09: 50 ug via INTRAVENOUS
  Filled 2023-03-09: qty 2

## 2023-03-09 MED ORDER — ONDANSETRON HCL 4 MG PO TABS: 4.0000 mg | ORAL_TABLET | ORAL | Status: DC | PRN

## 2023-03-09 MED ORDER — SENNOSIDES-DOCUSATE SODIUM 8.6-50 MG PO TABS
2.0000 | ORAL_TABLET | ORAL | Status: DC
  Administered 2023-03-09 – 2023-03-11 (×3): 2 via ORAL
  Filled 2023-03-09 (×3): qty 2

## 2023-03-09 MED ORDER — LIDOCAINE-EPINEPHRINE (PF) 2 %-1:200000 IJ SOLN
INTRAMUSCULAR | Status: DC | PRN
  Administered 2023-03-09: 5 mL via EPIDURAL

## 2023-03-09 MED ORDER — DIPHENHYDRAMINE HCL 25 MG PO CAPS: 25.0000 mg | ORAL_CAPSULE | Freq: Four times a day (QID) | ORAL | Status: DC | PRN

## 2023-03-09 MED ORDER — DIPHENHYDRAMINE HCL 50 MG/ML IJ SOLN: 12.5000 mg | INTRAMUSCULAR | Status: DC | PRN

## 2023-03-09 MED ORDER — TETANUS-DIPHTH-ACELL PERTUSSIS 5-2.5-18.5 LF-MCG/0.5 IM SUSY: 0.5000 mL | PREFILLED_SYRINGE | Freq: Once | INTRAMUSCULAR | Status: DC

## 2023-03-09 MED ORDER — BENZOCAINE-MENTHOL 20-0.5 % EX AERO
1.0000 | INHALATION_SPRAY | CUTANEOUS | Status: DC | PRN
  Administered 2023-03-10: 1 via TOPICAL
  Filled 2023-03-09: qty 56

## 2023-03-09 MED ORDER — SOD CITRATE-CITRIC ACID 500-334 MG/5ML PO SOLN: 30.0000 mL | ORAL | Status: DC | PRN

## 2023-03-09 MED ORDER — ACETAMINOPHEN 325 MG PO TABS: 650.0000 mg | ORAL_TABLET | ORAL | Status: DC | PRN

## 2023-03-09 MED ORDER — PHENYLEPHRINE 80 MCG/ML (10ML) SYRINGE FOR IV PUSH (FOR BLOOD PRESSURE SUPPORT): 80.0000 ug | PREFILLED_SYRINGE | INTRAVENOUS | Status: DC | PRN

## 2023-03-09 MED ORDER — SIMETHICONE 80 MG PO CHEW: 80.0000 mg | CHEWABLE_TABLET | ORAL | Status: DC | PRN

## 2023-03-09 MED ORDER — LAMOTRIGINE 25 MG PO TABS
125.0000 mg | ORAL_TABLET | Freq: Every evening | ORAL | Status: DC
  Administered 2023-03-10: 125 mg via ORAL
  Filled 2023-03-09 (×2): qty 1

## 2023-03-09 MED ORDER — ZOLPIDEM TARTRATE 5 MG PO TABS: 5.0000 mg | ORAL_TABLET | Freq: Every evening | ORAL | Status: DC | PRN

## 2023-03-09 MED ORDER — IBUPROFEN 600 MG PO TABS
600.0000 mg | ORAL_TABLET | Freq: Four times a day (QID) | ORAL | Status: DC
  Administered 2023-03-09 – 2023-03-11 (×7): 600 mg via ORAL
  Filled 2023-03-09 (×10): qty 1

## 2023-03-09 MED ORDER — LACTATED RINGERS IV SOLN: 500.0000 mL | Freq: Once | INTRAVENOUS | Status: DC

## 2023-03-09 MED ORDER — OXYTOCIN BOLUS FROM INFUSION
333.0000 mL | Freq: Once | INTRAVENOUS | Status: AC
  Administered 2023-03-09: 333 mL via INTRAVENOUS

## 2023-03-09 MED ORDER — LACTATED RINGERS IV SOLN: INTRAVENOUS | Status: DC

## 2023-03-09 MED ORDER — DIBUCAINE (PERIANAL) 1 % EX OINT: 1.0000 | TOPICAL_OINTMENT | CUTANEOUS | Status: DC | PRN

## 2023-03-09 MED ORDER — LIDOCAINE HCL (PF) 1 % IJ SOLN: 30.0000 mL | INTRAMUSCULAR | Status: DC | PRN

## 2023-03-09 MED ORDER — LACTATED RINGERS IV SOLN: 500.0000 mL | INTRAVENOUS | Status: DC | PRN

## 2023-03-09 MED ORDER — OXYCODONE-ACETAMINOPHEN 5-325 MG PO TABS: 2.0000 | ORAL_TABLET | ORAL | Status: DC | PRN

## 2023-03-09 MED ORDER — TRANEXAMIC ACID-NACL 1000-0.7 MG/100ML-% IV SOLN
INTRAVENOUS | Status: AC
  Administered 2023-03-09: 1000 mg via INTRAVENOUS
  Filled 2023-03-09: qty 100

## 2023-03-09 MED ORDER — TRANEXAMIC ACID-NACL 1000-0.7 MG/100ML-% IV SOLN: 1000.0000 mg | Freq: Once | INTRAVENOUS | Status: AC

## 2023-03-09 MED ORDER — WITCH HAZEL-GLYCERIN EX PADS: 1.0000 | MEDICATED_PAD | CUTANEOUS | Status: DC | PRN

## 2023-03-09 MED ORDER — LAMOTRIGINE 100 MG PO TABS
100.0000 mg | ORAL_TABLET | Freq: Every day | ORAL | Status: DC
  Filled 2023-03-09: qty 1

## 2023-03-09 MED ORDER — OXYTOCIN-SODIUM CHLORIDE 30-0.9 UT/500ML-% IV SOLN
2.5000 [IU]/h | INTRAVENOUS | Status: DC
  Administered 2023-03-09: 2.5 [IU]/h via INTRAVENOUS
  Filled 2023-03-09: qty 500

## 2023-03-09 MED ORDER — LAMOTRIGINE 100 MG PO TABS
100.0000 mg | ORAL_TABLET | Freq: Every evening | ORAL | Status: DC
  Administered 2023-03-09: 100 mg via ORAL
  Filled 2023-03-09: qty 1

## 2023-03-09 MED ORDER — EPHEDRINE 5 MG/ML INJ: 10.0000 mg | INTRAVENOUS | Status: DC | PRN

## 2023-03-09 MED ORDER — OXYCODONE-ACETAMINOPHEN 5-325 MG PO TABS: 1.0000 | ORAL_TABLET | ORAL | Status: DC | PRN

## 2023-03-09 MED ORDER — LORATADINE 10 MG PO TABS
10.0000 mg | ORAL_TABLET | Freq: Every day | ORAL | Status: DC
  Administered 2023-03-09 – 2023-03-11 (×3): 10 mg via ORAL
  Filled 2023-03-09 (×3): qty 1

## 2023-03-09 MED ORDER — FENTANYL-BUPIVACAINE-NACL 0.5-0.125-0.9 MG/250ML-% EP SOLN
12.0000 mL/h | EPIDURAL | Status: DC | PRN
  Administered 2023-03-09: 12 mL/h via EPIDURAL
  Filled 2023-03-09: qty 250

## 2023-03-09 MED ORDER — FENTANYL CITRATE (PF) 100 MCG/2ML IJ SOLN
50.0000 ug | INTRAMUSCULAR | Status: DC | PRN
  Administered 2023-03-09: 50 ug via INTRAVENOUS

## 2023-03-09 MED ORDER — LAMOTRIGINE 25 MG PO TABS
25.0000 mg | ORAL_TABLET | Freq: Once | ORAL | Status: AC
  Administered 2023-03-09: 25 mg via ORAL
  Filled 2023-03-09: qty 1

## 2023-03-09 MED ORDER — ONDANSETRON HCL 4 MG/2ML IJ SOLN: 4.0000 mg | Freq: Four times a day (QID) | INTRAMUSCULAR | Status: DC | PRN

## 2023-03-09 MED ORDER — ONDANSETRON HCL 4 MG/2ML IJ SOLN: 4.0000 mg | INTRAMUSCULAR | Status: DC | PRN

## 2023-03-09 NOTE — MAU Provider Note (Signed)
Chief Complaint:  Contractions   Event Date/Time   First Provider Initiated Contact with Patient 03/09/23 0114     HPI: Gloria Christensen is a 37 y.o. G2P0010 at 11w6dwho presents to maternity admissions reporting painful contractions.  BP noted to be elevated in triage, so I was asked to see her.. She reports good fetal movement, denies LOF, vaginal bleeding, vaginal itching/burning, urinary symptoms, h/a, dizziness, n/v, diarrhea, constipation or fever/chills.  She denies headache, visual changes or RUQ abdominal pain.  Hypertension This is a new problem. The current episode started today. The problem has been waxing and waning since onset. Pertinent negatives include no blurred vision (saw some spots earlier but they stopped), chest pain or headaches. There are no associated agents to hypertension. There are no known risk factors for coronary artery disease. Past treatments include nothing. There are no compliance problems.    RN Note: Gloria Christensen is a 37 y.o. at [redacted]w[redacted]d here in MAU reporting ctx since 2200. Denies LOF and scant bloody show. Was seen in office THurs and 1cm. Reports good FM  Onset of complaint: 2200  Past Medical History: Past Medical History:  Diagnosis Date   Allergy    Anemia    Bipolar disorder, unspecified (HCC)    Headache    SVT (supraventricular tachycardia)    f/u with card 12/28/22   Vaginal Pap smear, abnormal    biopsy, ok since    Past obstetric history: OB History  Gravida Para Term Preterm AB Living  2       1    SAB IAB Ectopic Multiple Live Births  1            # Outcome Date GA Lbr Len/2nd Weight Sex Delivery Anes PTL Lv  2 Current           1 SAB             Obstetric Comments  MAB/cytotec 2023    Past Surgical History: Past Surgical History:  Procedure Laterality Date   LASIK  2011   WISDOM TOOTH EXTRACTION      Family History: Family History  Problem Relation Age of Onset   Asthma Mother    Diabetes Father    Cancer  Maternal Grandfather    Heart disease Paternal Grandfather    Colon cancer Other        unknown family member   Hypertension Neg Hx    Stroke Neg Hx     Social History: Social History   Tobacco Use   Smoking status: Never   Smokeless tobacco: Never  Vaping Use   Vaping Use: Never used  Substance Use Topics   Alcohol use: Never    Alcohol/week: 0.0 standard drinks of alcohol   Drug use: Never    Allergies:  Allergies  Allergen Reactions   Ceclor [Cefaclor] Hives   Elemental Sulfur Hives   Ferrous Sulfate Nausea And Vomiting    Meds:  Medications Prior to Admission  Medication Sig Dispense Refill Last Dose   cetirizine (ZYRTEC) 10 MG tablet Take 10 mg by mouth daily.   03/08/2023   cholecalciferol (VITAMIN D3) 25 MCG (1000 UNIT) tablet Take 1,000 Units by mouth daily.   03/08/2023   ferrous gluconate (FERGON) 324 MG tablet Take 324 mg by mouth daily with breakfast.   03/08/2023   folic acid (FOLVITE) 400 MCG tablet Take 1 tablet every day by oral route as directed for 30 days.   03/08/2023   lamoTRIgine (LAMICTAL)  100 MG tablet Take 100 mg by mouth every evening.    03/08/2023   prenatal vitamin w/FE, FA (PRENATAL 1 + 1) 27-1 MG TABS tablet Take 1 tablet by mouth daily at 12 noon.   03/08/2023    I have reviewed patient's Past Medical Hx, Surgical Hx, Family Hx, Social Hx, medications and allergies.   ROS:  Review of Systems  Eyes:  Negative for blurred vision (saw some spots earlier but they stopped).  Cardiovascular:  Negative for chest pain.  Neurological:  Negative for headaches.   Other systems negative  Physical Exam  Patient Vitals for the past 24 hrs:  BP Temp Pulse Resp SpO2 Height Weight  03/09/23 0108 (!) 149/86 -- 73 18 100 % -- --  03/09/23 0049 139/77 -- -- -- -- -- --  03/09/23 0046 -- 97.8 F (36.6 C) 74 18 98 % 5\' 6"  (1.676 m) 88.9 kg   Vitals:   03/09/23 0108 03/09/23 0117 03/09/23 0121 03/09/23 0135  BP: (!) 149/86 (!) 132/57  125/63  Pulse: 73  66  71  Resp: 18     Temp:      SpO2: 100%  98%   Weight:      Height:       Vitals:   03/09/23 0117 03/09/23 0121 03/09/23 0135 03/09/23 0145  BP: (!) 132/57  125/63 130/69  Pulse: 66  71 72  Resp:      Temp:      SpO2:  98%  99%  Weight:      Height:        Constitutional: Well-developed, well-nourished female in no acute distress.  Cardiovascular: normal rate  Respiratory: normal effort GI: Abd soft, non-tender, gravid appropriate for gestational age.   No rebound or guarding. MS: Extremities nontender, no edema, normal ROM Neurologic: Alert and oriented x 4.  GU: Neg CVAT.  Dilation: 3 Effacement (%): 60 Cervical Position: Posterior Station: -3 Presentation: Vertex Exam by:: Anastasio Champion, RN  Dilation: 4.5 Effacement (%): 80 Cervical Position: Posterior Station: -3 Presentation: Vertex Exam by:: Anastasio Champion, RN  FHT:  Baseline 135 , moderate variability, accelerations present, no decelerations Contractions: q 2-3 mins Irregular     Labs: Results for orders placed or performed during the hospital encounter of 03/09/23 (from the past 24 hour(s))  Protein / creatinine ratio, urine     Status: None   Collection Time: 03/09/23  1:00 AM  Result Value Ref Range   Creatinine, Urine 16 mg/dL   Total Protein, Urine <6 mg/dL   Protein Creatinine Ratio        0.00 - 0.15 mg/mg[Cre]  CBC     Status: Abnormal   Collection Time: 03/09/23  2:58 AM  Result Value Ref Range   WBC 12.8 (H) 4.0 - 10.5 K/uL   RBC 4.48 3.87 - 5.11 MIL/uL   Hemoglobin 12.6 12.0 - 15.0 g/dL   HCT 16.1 09.6 - 04.5 %   MCV 84.8 80.0 - 100.0 fL   MCH 28.1 26.0 - 34.0 pg   MCHC 33.2 30.0 - 36.0 g/dL   RDW 40.9 81.1 - 91.4 %   Platelets 281 150 - 400 K/uL   nRBC 0.0 0.0 - 0.2 %  Comprehensive metabolic panel     Status: Abnormal   Collection Time: 03/09/23  2:58 AM  Result Value Ref Range   Sodium 134 (L) 135 - 145 mmol/L   Potassium 3.9 3.5 - 5.1 mmol/L   Chloride 102 98 - 111  mmol/L    CO2 18 (L) 22 - 32 mmol/L   Glucose, Bld 101 (H) 70 - 99 mg/dL   BUN 11 6 - 20 mg/dL   Creatinine, Ser 9.60 0.44 - 1.00 mg/dL   Calcium 9.1 8.9 - 45.4 mg/dL   Total Protein 7.3 6.5 - 8.1 g/dL   Albumin 2.9 (L) 3.5 - 5.0 g/dL   AST 20 15 - 41 U/L   ALT 18 0 - 44 U/L   Alkaline Phosphatase 154 (H) 38 - 126 U/L   Total Bilirubin 0.4 0.3 - 1.2 mg/dL   GFR, Estimated >09 >81 mL/min   Anion gap 14 5 - 15  Lactate dehydrogenase     Status: None   Collection Time: 03/09/23  2:58 AM  Result Value Ref Range   LDH 159 98 - 192 U/L       Imaging:  No results found.  MAU Course/MDM: I have reviewed the triage vital signs and the nursing notes.   Pertinent labs & imaging results that were available during my care of the patient were reviewed by me and considered in my medical decision making (see chart for details).      I have reviewed her medical records including past results, notes and treatments.   I have ordered labs and reviewed results.  NST reviewed, reassuring, category I Treatments in MAU included EFM, analgesia.    Assessment: Single IUP at [redacted]w[redacted]d Latent labor > Active Labor Isolated elevated blood pressures with normal labs  Plan: Admit per Dr Connye Burkitt MD to follow    Wynelle Bourgeois CNM, MSN Certified Nurse-Midwife 03/09/2023 1:14 AM

## 2023-03-09 NOTE — Lactation Note (Addendum)
This note was copied from a baby's chart. Lactation Consultation Note  Patient Name: Boy Quaniyah Ladd [id number] Date: 03/09/2023 Age:36 hours Reason for consult: Breastfeeding assistance;Mother's request Birth Parent requested LC services, LC continue to work with Birth Parent latching infant at the breast, Birth Parent latched infant on her left breast using the cross cradle hold with feeding, Worked on breast alignment ( chest to breast) and infant having a deeper latch. Infant was still breastfeeding after 6 minutes when LC left the room. Birth Parent will continue to work on infant sustaining his latch with depth.   Maternal Data    Feeding Mother's Current Feeding Choice: Breast Milk  LATCH Score Latch: Grasps breast easily, tongue down, lips flanged, rhythmical sucking.  Audible Swallowing: A few with stimulation  Type of Nipple: Everted at rest and after stimulation  Comfort (Breast/Nipple): Soft / non-tender  Hold (Positioning): Assistance needed to correctly position infant at breast and maintain latch.  LATCH Score: 8   Lactation Tools Discussed/Used    Interventions Interventions: Skin to skin;Position options;Support pillows;Assisted with latch;Adjust position;Education;Breast compression  Discharge    Consult Status Consult Status: Follow-up Date: 03/10/23 Follow-up type: In-patient    Frederico Hamman 03/09/2023, 11:48 PM

## 2023-03-09 NOTE — Lactation Note (Signed)
This note was copied from a baby's chart. Lactation Consultation Note  Patient Name: Gloria Christensen [id number] Date: 03/09/2023 Age:36 hours Reason for consult: Initial assessment;1st time breastfeeding;Early term 37-38.6wks  P1, Reviewed hand expression and assisted with latching in both football and cross cradle hold.  After a few attempts baby was able to sustain latch in football hold.  Feed on demand with cues.  Goal 8-12+ times per day after first 24 hrs.  Place baby STS if not cueing.  Mom made aware of O/P services, breastfeeding support groups, community resources, and our phone # for post-discharge questions.    Maternal Data Has patient been taught Hand Expression?: Yes Does the patient have breastfeeding experience prior to this delivery?: No  Feeding Mother's Current Feeding Choice: Breast Milk  LATCH Score Latch: Repeated attempts needed to sustain latch, nipple held in mouth throughout feeding, stimulation needed to elicit sucking reflex.  Audible Swallowing: A few with stimulation  Type of Nipple: Everted at rest and after stimulation  Comfort (Breast/Nipple): Soft / non-tender  Hold (Positioning): Assistance needed to correctly position infant at breast and maintain latch.  LATCH Score: 7   Lactation Tools Discussed/Used    Interventions Interventions: Breast feeding basics reviewed;Assisted with latch;Skin to skin;Hand express;Education;LC Services brochure  Discharge    Consult Status Consult Status: Follow-up Date: 03/10/23 Follow-up type: In-patient    Dahlia Byes [facility] 03/09/2023, 2:54 PM

## 2023-03-09 NOTE — Anesthesia Procedure Notes (Signed)
Epidural Patient location during procedure: OB Start time: 03/09/2023 4:55 AM End time: 03/09/2023 5:05 AM  Staffing Anesthesiologist: Elmer Picker, MD Performed: anesthesiologist   Preanesthetic Checklist Completed: patient identified, IV checked, risks and benefits discussed, monitors and equipment checked, pre-op evaluation and timeout performed  Epidural Patient position: sitting Prep: DuraPrep and site prepped and draped Patient monitoring: continuous pulse ox, blood pressure, heart rate and cardiac monitor Approach: midline Location: L3-L4 Injection technique: LOR air  Needle:  Needle type: Tuohy  Needle gauge: 17 G Needle length: 9 cm Needle insertion depth: 5 cm Catheter type: closed end flexible Catheter size: 19 Gauge Catheter at skin depth: 10 cm Test dose: negative  Assessment Sensory level: T8 Events: blood not aspirated, no cerebrospinal fluid, injection not painful, no injection resistance, no paresthesia and negative IV test  Additional Notes Patient identified. Risks/Benefits/Options discussed with patient including but not limited to bleeding, infection, nerve damage, paralysis, failed block, incomplete pain control, headache, blood pressure changes, nausea, vomiting, reactions to medication both or allergic, itching and postpartum back pain. Confirmed with bedside nurse the patient's most recent platelet count. Confirmed with patient that they are not currently taking any anticoagulation, have any bleeding history or any family history of bleeding disorders. Patient expressed understanding and wished to proceed. All questions were answered. Sterile technique was used throughout the entire procedure. Please see nursing notes for vital signs. Test dose was given through epidural catheter and negative prior to continuing to dose epidural or start infusion. Warning signs of high block given to the patient including shortness of breath, tingling/numbness in hands,  complete motor block, or any concerning symptoms with instructions to call for help. Patient was given instructions on fall risk and not to get out of bed. All questions and concerns addressed with instructions to call with any issues or inadequate analgesia.  Reason for block:procedure for pain

## 2023-03-09 NOTE — Progress Notes (Signed)
Subjective:    Comfortable with epidural. Agrees to amniotomy  Objective:    VS: BP (!) 116/58   Pulse 71   Temp 97.8 F (36.6 C) (Oral)   Resp 17   Ht 5\' 6"  (1.676 m)   Wt 88.9 kg   LMP 06/10/2022   SpO2 99%   BMI 31.64 kg/m  FHR : baseline 135 / variability moderate / accelerations present / absent decelerations Toco: contractions every 3 minutes  Membranes: AROM, scant, clear Dilation: 10 Dilation Complete Date: 03/09/23 Dilation Complete Time: 0935 Effacement (%): 100 Cervical Position: Posterior Station: Plus 1 Presentation: Vertex Exam by:: [place], CNM   Assessment/Plan:   37 y.o. G2P0010 [redacted]w[redacted]d  Labor: Progressing normally Fetal Wellbeing:  Category I Pain Control:  Epidural I/D:   GBS neg Anticipated MOD:  NSVD  Roma Schanz DNP, CNM 03/09/2023 9:50 AM

## 2023-03-09 NOTE — Anesthesia Preprocedure Evaluation (Signed)
Anesthesia Evaluation  Patient identified by MRN, date of birth, ID band Patient awake    Reviewed: Allergy & Precautions, NPO status , Patient's Chart, lab work & pertinent test results  Airway Mallampati: II  TM Distance: >3 FB Neck ROM: Full    Dental no notable dental hx.    Pulmonary neg pulmonary ROS   Pulmonary exam normal breath sounds clear to auscultation       Cardiovascular hypertension (elevated BP in MAU), Normal cardiovascular exam+ dysrhythmias Supra Ventricular Tachycardia  Rhythm:Regular Rate:Normal     Neuro/Psych  Headaches PSYCHIATRIC DISORDERS   Bipolar Disorder      GI/Hepatic negative GI ROS, Neg liver ROS,,,  Endo/Other  negative endocrine ROS    Renal/GU negative Renal ROS  negative genitourinary   Musculoskeletal negative musculoskeletal ROS (+)    Abdominal   Peds  Hematology negative hematology ROS (+)   Anesthesia Other Findings   Reproductive/Obstetrics (+) Pregnancy                             Anesthesia Physical Anesthesia Plan  ASA: 3  Anesthesia Plan: Epidural   Post-op Pain Management:    Induction:   PONV Risk Score and Plan: Treatment may vary due to age or medical condition  Airway Management Planned: Natural Airway  Additional Equipment:   Intra-op Plan:   Post-operative Plan:   Informed Consent: I have reviewed the patients History and Physical, chart, labs and discussed the procedure including the risks, benefits and alternatives for the proposed anesthesia with the patient or authorized representative who has indicated his/her understanding and acceptance.       Plan Discussed with: Anesthesiologist  Anesthesia Plan Comments: (Patient identified. Risks, benefits, options discussed with patient including but not limited to bleeding, infection, nerve damage, paralysis, failed block, incomplete pain control, headache, blood pressure  changes, nausea, vomiting, reactions to medication, itching, and post partum back pain. Confirmed with bedside nurse the patient's most recent platelet count. Confirmed with the patient that they are not taking any anticoagulation, have any bleeding history or any family history of bleeding disorders. Patient expressed understanding and wishes to proceed. All questions were answered. )       Anesthesia Quick Evaluation

## 2023-03-09 NOTE — MAU Note (Signed)
.  Gloria Christensen is a 37 y.o. at [redacted]w[redacted]d here in MAU reporting ctx since 2200. Denies LOF and scant bloody show. Was seen in office THurs and 1cm. Reports good FM  Onset of complaint: 2200 Pain score: 8 Vitals:   03/09/23 0046 03/09/23 0049  BP:  139/77  Pulse: 74   Resp: 18   Temp: 97.8 F (36.6 C)   SpO2: 98%      FHT:150 Lab orders placed from triage:  mau labor eval

## 2023-03-09 NOTE — H&P (Signed)
OB ADMISSION/ HISTORY & PHYSICAL:  Admission Date: 03/09/2023 12:37 AM  Admit Diagnosis: Normal labor  Gloria Christensen is a 37 y.o. female G2P0010 [redacted]w[redacted]d presenting for labor eval. Endorses active FM, denies LOF and vaginal bleeding. Ctx began @ 2200. Pregnancy complicated by AMA, vaginal bleeding in 1st and 3rd trimesters R/T cervical polyps, marginal insertion of cord, and fissures  History of current pregnancy: G2P0010   Patient entered care with [facility] at 11+6 wks.   EDC 03/17/23 by US@ 10+5 wks  Anatomy scan:  24+6 wks, complete w/ posterior placenta.   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    Prenatal Labs: ABO, Rh: --/--/A POS (05/17 0258) Antibody: NEG (05/17 0258) Rubella: Nonimmune (11/16 0000)  RPR: Nonreactive (11/16 0000)  HBsAg: Negative (11/16 0000)  HIV: Non Reactive (05/17 0258)  GTT: normal 1 hr GBS: Negative/-- (05/05 0000)  GC/CHL: neg.neg Genetics: low-risk female, neg Horizon Vaccines: Tdap: 02/27/23 Influenza: Oct 2023   OB History  Gravida Para Term Preterm AB Living  2       1    SAB IAB Ectopic Multiple Live Births  1            # Outcome Date GA Lbr Len/2nd Weight Sex Delivery Anes PTL Lv  2 Current           1 SAB             Obstetric Comments  MAB/cytotec 2023    Medical / Surgical History: Past medical history:  Past Medical History:  Diagnosis Date   Allergy    Anemia    Bipolar disorder, unspecified (HCC)    Headache    SVT (supraventricular tachycardia)    f/u with card 12/28/22   Vaginal Pap smear, abnormal    biopsy, ok since    Past surgical history:  Past Surgical History:   Procedure Laterality Date   LASIK  2011   WISDOM TOOTH EXTRACTION     Family History:  Family History  Problem Relation Age of Onset   Asthma Mother    Diabetes Father    Cancer Maternal Grandfather    Heart disease Paternal Grandfather    Colon cancer Other        unknown family member   Hypertension Neg Hx    Stroke Neg Hx     Social History:  reports that she has never smoked. She has never used smokeless tobacco. She reports that she does not drink alcohol and does not use drugs.  Allergies: Ceclor [cefaclor], Elemental sulfur, and Ferrous sulfate   Current Medications at time of admission:  Prior to Admission medications   Medication Sig Start Date End Date Taking? Authorizing Provider  cetirizine (ZYRTEC) 10 MG tablet Take 10 mg by mouth daily.   Yes [provider]  cholecalciferol (VITAMIN D3) 25 MCG (1000 UNIT) tablet Take 1,000 Units by mouth daily.   Yes [provider]  ferrous gluconate (FERGON) 324 MG tablet Take 324 mg by mouth daily with breakfast.   Yes [provider]  folic acid (FOLVITE) 400 MCG tablet Take 1 tablet every day by oral route as directed for 30 days.   Yes  [provider]  lamoTRIgine (LAMICTAL) 100 MG tablet Take 100 mg by mouth every evening.  11/14/19  Yes [provider]  prenatal vitamin w/FE, FA (PRENATAL 1 + 1) 27-1 MG TABS tablet Take 1 tablet by mouth daily at 12 noon.   Yes [provider]    Review of Systems: Constitutional: Negative   HENT: Negative   Eyes: Negative   Respiratory: Negative   Cardiovascular: Negative   Gastrointestinal: Negative  Genitourinary: pos for bloody show, neg for LOF   Musculoskeletal: Negative   Skin: Negative   Neurological: Negative   Endo/Heme/Allergies: Negative   Psychiatric/Behavioral: Negative    Physical Exam: VS: Blood pressure (!) 116/58, pulse 71, temperature 97.8 F (36.6 C), temperature source Oral, resp. rate 17, height 5\' 6"   (1.676 m), weight 88.9 kg, last menstrual period 06/10/2022, SpO2 99 %. AAO x3, no signs of distress Cardiovascular: RRR Respiratory: Unlabored GU/GI: Abdomen gravid, non-tender, non-distended, active FM, vertex Extremities: no edema, negative for pain, tenderness, and cords  Cervical exam:Dilation: 4.5 Effacement (%): 80 Station: -3  FHR: baseline rate 130 / variability moderate / accelerations present / absent decelerations TOCO: 3-4   Prenatal Transfer Tool  Maternal Diabetes: No Genetic Screening: Normal Maternal Ultrasounds/Referrals: Normal Fetal Ultrasounds or other Referrals:  None Maternal Substance Abuse:  No Significant Maternal Medications:  None Significant Maternal Lab Results: Group B Strep negative Number of Prenatal Visits:greater than 3 verified prenatal visits Other Comments:  None    Assessment: 37 y.o. G2P0010 [redacted]w[redacted]d  Latent stage of labor FHR category 1 GBS neg Pain management plan: epidural   Plan:  Admit to L&D Routine admission orders Epidural PRN Expectant management Dr Connye Burkitt notified by MAU of admission  Plan of care continued by Dr. Normand Sloop and Lizbeth Bark, CNM  Roma Schanz DNP, CNM 03/09/2023 9:38 AM

## 2023-03-09 NOTE — Lactation Note (Addendum)
This note was copied from a baby's chart. Lactation Consultation Note  Patient Name: Gloria Christensen Date: 03/09/2023 Age:36 hours Reason for consult: Breastfeeding assistance;1st time breastfeeding;Early term 71-38.6wks Birth Parent requested latch assistance, Birth Parent expressed 1 ml prior to latching infant, infant been sleepy and time for a feeding. Birth Parent latched infant on her right breast using the football hold position, infant was on and off the breast initially but after 8 minutes he started sustaining his latch, infant breastfeed for 19 minutes. Birth Parent will continue to work towards infant sustaining his latch and will ask RN/LC for further latch assistance if needed. Birth Parent will continue to breastfeed infant by cues, on demand, 8 to 12+ times within 24 hours, STS. Birth Parent knows if infant doesn't latch to hand express and give infant back EBM by spoon.   Maternal Data Has patient been taught Hand Expression?: Yes Does the patient have breastfeeding experience prior to this delivery?: No  Feeding Mother's Current Feeding Choice: Breast Milk  LATCH Score Latch: Repeated attempts needed to sustain latch, nipple held in mouth throughout feeding, stimulation needed to elicit sucking reflex.  Audible Swallowing: A few with stimulation  Type of Nipple: Everted at rest and after stimulation  Comfort (Breast/Nipple): Soft / non-tender  Hold (Positioning): Assistance needed to correctly position infant at breast and maintain latch.  LATCH Score: 7   Lactation Tools Discussed/Used    Interventions Interventions: Expressed milk;Position options;Education;Support pillows;Adjust position;Breast compression;Hand express;Assisted with latch;Skin to skin  Discharge    Consult Status Consult Status: Follow-up Date: 03/10/23 Follow-up type: In-patient    Frederico Hamman 03/09/2023, 6:33 PM

## 2023-03-10 LAB — CBC
HCT: 28.7 % — ABNORMAL LOW (ref 36.0–46.0)
Hemoglobin: 9.4 g/dL — ABNORMAL LOW (ref 12.0–15.0)
MCH: 28.6 pg (ref 26.0–34.0)
MCHC: 32.8 g/dL (ref 30.0–36.0)
MCV: 87.2 fL (ref 80.0–100.0)
Platelets: 212 K/uL (ref 150–400)
RBC: 3.29 MIL/uL — ABNORMAL LOW (ref 3.87–5.11)
RDW: 13.2 % (ref 11.5–15.5)
WBC: 13.2 K/uL — ABNORMAL HIGH (ref 4.0–10.5)
nRBC: 0 % (ref 0.0–0.2)

## 2023-03-10 LAB — RPR: RPR Ser Ql: NONREACTIVE

## 2023-03-10 NOTE — Clinical Social Work Maternal (Signed)
CLINICAL SOCIAL WORK MATERNAL/CHILD NOTE  Patient Details  Name: Gloria Christensen MRN: [id number] Date of Birth: 10/07/1986  Date:  03/10/2023  Clinical Social Worker Initiating Note:  Karry Causer, LCSWA Date/Time: Initiated:  03/10/23/1153     Child's Name:  Gloria Christensen   Biological Parents:  Mother, Father (Gloria Christensen: Gloria Christensen, DOB: 12/11/1987)   Need for Interpreter:  None   Reason for Referral:  Behavioral Health Concerns   Address:  [address] [place] [address]    Phone number:  [phone] (home)     Additional phone number:   Household Members/Support Persons (HM/SP):   Household Member/Support Person 1   HM/SP Name Relationship DOB or Age  HM/SP -1 Gloria Christensen Gloria Christensen/Spouse 12/11/1987  HM/SP -2        HM/SP -3        HM/SP -4        HM/SP -5        HM/SP -6        HM/SP -7        HM/SP -8          Natural Supports (not living in the home):  Extended Family, Church, Friends, Immediate Family   Professional Supports: Therapist   Employment: Full-time   Type of Work: Teacher   Education:  College graduate   Homebound arranged:    Financial Resources:  Private Insurance    Other Resources:      Cultural/Religious Considerations Which May Impact Care:  None identified  Strengths:  Psychotropic Medications, Ability to meet basic needs  , Home prepared for child  , Pediatrician chosen   Psychotropic Medications:  Lamictal      Pediatrician:    [place] area  Pediatrician List:    [facility] [place] (Peds)  [place]    [place]    [place]    [place]    [place]      Pediatrician Fax Number:    Risk Factors/Current Problems:  Mental Health Concerns     Cognitive State:  Alert  , Goal Oriented  , Able to Concentrate  , Insightful     Mood/Affect:  Comfortable  , Interested  , Euthymic  , Relaxed     CSW Assessment: CSW received consult for hx Bipolar  Disorder. CSW met with Gloria Christensen to offer support and complete assessment.  When CSW entered room, Gloria Christensen was observed laying in hospital bed, holding and bonding with infant "Gloria." Gloria Christensen was present. Gloria Christensen provided verbal consent to speak in front of Gloria Christensen about anything. CSW introduced self and reason for consult. Gloria Christensen presented as warm and remained engaged throughout consult.  CSW inquired about Gloria Christensen's mental health history. Gloria Christensen confirms she has a diagnosis of Bipolar Disorder II, stating she was first diagnosed at age 37. Gloria Christensen reports she is current on Lamictal, which she has taken since age 37. Gloria Christensen reports she has not had a manic or depressive episode in at least 10 years. Gloria Christensen reports her mood was overall stable during pregnancy; however, she endorsed some feelings of anxiety towards the end of pregnancy, which she believes were triggered by difficulty sleeping. Gloria Christensen demonstrated good insight, as she linked a lack of sleep to an increase in mental health symptoms. Gloria Christensen reports she has a therapist who she utilizes on an as needed basis. Gloria Christensen reports she met with her therapist two times during her pregnancy and has a postpartum therapy appointment scheduled in two weeks. Gloria Christensen reports she also   has a medication check with her provider every 3 months to manage her mood symptoms. Gloria Christensen reports a strong support system, marked by Gloria Christensen, her mother, father, step mother, church community and friends. Gloria Christensen reports Gloria Christensen will take 2 weeks off from work postpartum to support Gloria Christensen and her mother will visit for 1 week if needed once Gloria Christensen returns to work. Gloria Christensen denied current SI/HI.  CSW provided education regarding the baby blues period vs. perinatal mood disorders, discussed treatment and gave resources for mental health follow up if concerns arise. CSW reviewed warning signs of postpartum psychosis, given Gloria Christensen's diagnosis of Bipolar Disorder. CSW recommends self-evaluation during the postpartum time period using the New Mom Checklist from Postpartum  Progress and encouraged Gloria Christensen to contact a medical professional if symptoms are noted at any time.    Gloria Christensen reports she has all needed items for infant, including a car seat and bassinet. Gloria Christensen has chosen [facility] for infant's follow up care.  CSW provided review of Sudden Infant Death Syndrome (SIDS) precautions.    CSW identifies no further need for intervention and no barriers to discharge at this time.  CSW Plan/Description:  No Further Intervention Required/No Barriers to Discharge, Sudden Infant Death Syndrome (SIDS) Education, Perinatal Mood and Anxiety Disorder (PMADs) Education    Gloria Christensen K Gloria Christensen, LCSWA 03/10/2023, 12:21 PM 

## 2023-03-10 NOTE — Lactation Note (Signed)
This note was copied from a baby's chart. Lactation Consultation Note  Patient Name: Gloria Anaiza Christensen [id number]'[id number] Date: 03/10/2023 Age:36 hours Reason for consult: Difficult latch;Mother's request;Infant weight loss (-3.29% weight loss).  P1, term female infant with 10 stools and one void since birth. LC worked with Musician infant on her left breast, infant mostly been latching on the right breast. Birth Parent latched infant on both breast with this feeding, infant BF for 15 minutes was on and off the left breast but is learning. Support Person assisted with hand expression and infant was given 4 mls of colostrum by spoon. Birth Parent understands that on day 2 of life infant may cluster feed and this is normal pattern of behavior. Birth Parent plans to continue to BF infant by cues, on demand, 8 to 12+ times within 24 hours, STS. Continue to ask RN/LC for latch assistance if needed. Will latch infant on both breast during a feeding if infant is still cuing after feeding on the first breast and knows how to hand express and give back EBM by spoon.  Maternal Data    Feeding Mother's Current Feeding Choice: Breast Milk  LATCH Score Latch: Repeated attempts needed to sustain latch, nipple held in mouth throughout feeding, stimulation needed to elicit sucking reflex.  Audible Swallowing: Spontaneous and intermittent  Type of Nipple: Everted at rest and after stimulation  Comfort (Breast/Nipple): Soft / non-tender  Hold (Positioning): Assistance needed to correctly position infant at breast and maintain latch.  LATCH Score: 8   Lactation Tools Discussed/Used    Interventions Interventions: Position options;Support pillows;Assisted with latch;Adjust position;Skin to skin;Expressed milk;Breast compression;Hand express  Discharge    Consult Status Consult Status: Follow-up Date: 03/11/23 Follow-up type: In-patient    Frederico Hamman 03/10/2023, 5:27 PM

## 2023-03-10 NOTE — Lactation Note (Signed)
This note was copied from a baby's chart. Lactation Consultation Note  Patient Name: Gloria Christensen ZOXWR'[id number] Date: 03/10/2023 Age:36 hours Reason for consult: Follow-up assessment;Early term 37-38.6wks;1st time breastfeeding  P1, Called to room to assist with feeding. Mother hand expressed drops of colostrum prior to latch attempt. Assisted with latching baby in cross cradle hold. Had mother compress breast to achieve a deep latch and provided pillows for support.  After a few attempts, baby was able to sustain latch. Maternal Data Has patient been taught Hand Expression?: Yes  Feeding Mother's Current Feeding Choice: Breast Milk  LATCH Score Latch: Repeated attempts needed to sustain latch, nipple held in mouth throughout feeding, stimulation needed to elicit sucking reflex.  Audible Swallowing: A few with stimulation  Type of Nipple: Everted at rest and after stimulation  Comfort (Breast/Nipple): Soft / non-tender  Hold (Positioning): Assistance needed to correctly position infant at breast and maintain latch.  LATCH Score: 7  Interventions Interventions: Breast feeding basics reviewed;Assisted with latch;Skin to skin;Hand express;Position options;Education  Consult Status Consult Status: Complete Date: 03/10/23    Dahlia Byes [facility] 03/10/2023, 1:49 PM

## 2023-03-10 NOTE — Anesthesia Postprocedure Evaluation (Signed)
Anesthesia Post Note  Patient: Laniece C Pillars  Procedure(s) Performed: AN AD HOC LABOR EPIDURAL     Patient location during evaluation: Mother Baby Anesthesia Type: Epidural Level of consciousness: awake, oriented and awake and alert Pain management: pain level controlled Vital Signs Assessment: post-procedure vital signs reviewed and stable Respiratory status: spontaneous breathing, respiratory function stable and nonlabored ventilation Cardiovascular status: stable Postop Assessment: no headache, adequate PO intake, able to ambulate, patient able to bend at knees and no apparent nausea or vomiting Anesthetic complications: no   No notable events documented.  Last Vitals:  Vitals:   03/09/23 2206 03/10/23 0557  BP: 124/79 125/79  Pulse: 80 80  Resp: 18 18  Temp: (!) 36.4 C (!) 36.4 C  SpO2:      Last Pain:  Vitals:   03/10/23 0727  TempSrc:   PainSc: 0-No pain   Pain Goal: Patients Stated Pain Goal: 0 (03/09/23 0412)                 Rashawna Scoles

## 2023-03-10 NOTE — Progress Notes (Signed)
PPD# 1 SVD w/ 2nd degree laceration Information for the patient's newborn:  Betzayra, Lafountaine [[id number]]  female   Baby Name undecided Circumcision yes   S:   Reports feeling good Tolerating PO fluid and solids No nausea or vomiting Bleeding is light, no clots Pain controlled with  PO and topical meds Up ad lib / ambulatory / voiding w/o difficulty Feeding: Breast    O:   VS: BP 125/79   Pulse 80   Temp (!) 97.5 F (36.4 C) (Oral)   Resp 18   Ht 5\' 6"  (1.676 m)   Wt 88.9 kg   LMP 06/10/2022   SpO2 98% Comment: room air  Breastfeeding Unknown   BMI 31.64 kg/m   LABS:  Recent Labs    03/09/23 1211 03/10/23 0545  WBC 16.6* 13.2*  HGB 10.9* 9.4*  PLT 228 212   Blood type: --/--/A POS (05/17 0258) Rubella: Nonimmune (11/16 0000)                      I&O: Intake/Output      05/17 0701 05/18 0700 05/18 0701 05/19 0700   Urine (mL/kg/hr) 350 (0.2)    Blood 300    Total Output 650    Net -650         Urine Occurrence 1 x      Physical Exam: Alert and oriented X3 Lungs: Clear and unlabored Heart: regular rate and rhythm / no mumurs Abdomen: soft, non-tender, non-distended  Fundus: firm, non-tender, U-2 Perineum: well-approximated, healing Lochia: appropriate Extremities: no edema, no calf pain, tenderness, or cords    A:  PPD # 1  Normal exam  P:  Routine post partum orders Lactation support PRN Anticipate D/C on 03/11/23   Plan reviewed w/ Dr. Tresa Moore, DNP, CNM 03/10/2023, 9:56 AM

## 2023-03-11 MED ORDER — IBUPROFEN 600 MG PO TABS: 600.0000 mg | ORAL_TABLET | Freq: Four times a day (QID) | ORAL | 0 refills | Status: AC

## 2023-03-11 MED ORDER — MEASLES, MUMPS & RUBELLA VAC IJ SOLR
0.5000 mL | Freq: Once | INTRAMUSCULAR | Status: AC
  Administered 2023-03-11: 0.5 mL via SUBCUTANEOUS
  Filled 2023-03-11: qty 0.5

## 2023-03-11 MED ORDER — BENZOCAINE-MENTHOL 20-0.5 % EX AERO: 1.0000 | INHALATION_SPRAY | CUTANEOUS | 1 refills | Status: DC | PRN

## 2023-03-11 MED ORDER — ACETAMINOPHEN 325 MG PO TABS: 650.0000 mg | ORAL_TABLET | ORAL | 0 refills | Status: AC | PRN

## 2023-03-11 NOTE — Discharge Instructions (Signed)
WHAT TO LOOK OUT FOR: Fever of 100.4 or above Mastitis: feels like flu and breasts hurt Infection: increased pain, swelling or redness Blood clots golf ball size or larger Postpartum depression   Congratulations on your newest addition! 

## 2023-03-11 NOTE — Lactation Note (Signed)
This note was copied from a baby's chart. Lactation Consultation Note  Patient Name: Gloria Christensen [id number] Date: 03/11/2023 Age:36 hours Reason for consult: Mother's request;Follow-up assessment;Difficult latch;Early term 37-38.6wks;Primapara;1st time breastfeeding  LC in to room, per lactating parent request. Infant was recently fed formula and he is sleeping upon LC's arrival. Parents report difficulty latching overnight and using formula to soothe infant. Lactating parent is concerned about current milk supply. LP has used DEBP but collecting not colostrum. Parents state they are very sleep deprived and confused with infant's behavior. LC reviewed hand express, colostrum is easily expressed. LC reviewed how to use manual pump, fitted a 21-mm flange ( LP may need an 18-mm flange/insert) collected ~4-mL and spoonfed infant. Noted nipples are intact but short shafted, my need to pre-pump. Encouraged parent to use DEBP every 3h for stimulation and supplementation, LP collected ~5-mL combined after pumping. Reinforced pumping frequency and reviewed care of parts. Parents are pace bottle-feeding and have volume guidelines for formula feeding. Discussed normal behavior and patterns, voids and stools as signs good intake, pumping, clusterfeeding, skin to skin. Talked about milk coming into volume and managing engorgement.   Plan: 1-Feeding on demand or 8-12 times in 24h period. 2-Hand express/pump as needed for supplementation 3-If needed, supplement with formula following guidelines and pacing. 4-Encouraged parental rest, hydration and food intake.   Contact LC as needed for feeds/support/concerns/questions. All questions answered at this time. Reviewed local resources.    Maternal Data Has patient been taught Hand Expression?: Yes Does the patient have breastfeeding experience prior to this delivery?: No  Feeding Mother's Current Feeding Choice: Breast Milk and Formula Nipple Type: Slow -  flow  LATCH Score No latch observed during this encounter.    Lactation Tools Discussed/Used Tools: Pump;Flanges Flange Size: 21 (may need a 18mm flanges) Breast pump type: Manual;Double-Electric Breast Pump Pump Education: Milk Storage;Setup, frequency, and cleaning Reason for Pumping: stimulation and supplementation Pumping frequency: every 3h Pumped volume: 5 mL  Interventions Interventions: Breast feeding basics reviewed;Skin to skin;Breast massage;Hand express;Pre-pump if needed;Hand pump;DEBP;Expressed milk;Education;Pace feeding;LC Services brochure  Discharge Discharge Education: Engorgement and breast care;Warning signs for feeding baby Pump: Personal;DEBP (Spectra) WIC Program: No  Consult Status Consult Status: Complete Date: 03/11/23 Follow-up type: Call as needed    Daemyn Gariepy A Higuera Ancidey 03/11/2023, 11:07 AM

## 2023-03-14 ENCOUNTER — Telehealth (HOSPITAL_COMMUNITY): Payer: Self-pay

## 2023-03-14 NOTE — Telephone Encounter (Signed)
The birth parent called with questions about breastfeeding. LC called the birth parent but she did not answer. A message was left for the birth parent to call back at a more convenient time.

## 2023-03-14 NOTE — Discharge Summary (Signed)
Postpartum Discharge Summary  Date of Service updated5/19/22     Patient Name: Gloria Christensen DOB: June 22, 1986 MRN: [id number]  Date of admission: 03/09/2023 Delivery date:03/09/2023  Delivering provider: Rhea Pink B  Date of discharge: 03/14/2023  Admitting diagnosis: Normal labor [O80, Z37.9] Intrauterine pregnancy: [redacted]w[redacted]d     Secondary diagnosis:  Principal Problem:   Normal labor Active Problems:   AMA (advanced maternal age) multigravida 35+, third trimester  Additional problems: none    Discharge diagnosis: Term Pregnancy Delivered                                              Post partum procedures: none Augmentation: AROM Complications: None  Hospital course: Onset of Labor With Vaginal Delivery      37 y.o. yo G2P1011 at [redacted]w[redacted]d was admitted in Latent Labor on 03/09/2023. Labor course was complicated bynothing  Membrane Rupture Time/Date: 9:35 AM ,03/09/2023   Delivery Method:Vaginal, Spontaneous  Episiotomy: None  Lacerations:  2nd degree;Perineal  Patient had a postpartum course complicated by nothing.  She is ambulating, tolerating a regular diet, passing flatus, and urinating well. Patient is discharged home in stable condition on 03/14/23.  Newborn Data: Birth date:03/09/2023  Birth time:10:35 AM  Gender:Female  Living status:Living  Apgars:9 ,9  Weight:3190 g   Magnesium Sulfate received: No BMZ received: No Rhophylac:No MMR:No T-DaP:   Flu: No Transfusion:No  Physical exam  Vitals:   03/10/23 1423 03/10/23 1550 03/10/23 2145 03/11/23 0500  BP: 117/79 121/74 116/75 115/75  Pulse: 87 88 88 80  Resp: 16 16 18 18   Temp: (!) 97.5 F (36.4 C) 98.1 F (36.7 C) 97.8 F (36.6 C) 97.6 F (36.4 C)  TempSrc: Oral Oral Oral Oral  SpO2: 98% 99%    Weight:      Height:       General: alert and cooperative Lochia: appropriate Uterine Fundus: firm Incision: N/A DVT Evaluation: No evidence of DVT seen on physical exam. Labs: Lab Results  Component  Value Date   WBC 13.2 (H) 03/10/2023   HGB 9.4 (L) 03/10/2023   HCT 28.7 (L) 03/10/2023   MCV 87.2 03/10/2023   PLT 212 03/10/2023      Latest Ref Rng & Units 03/09/2023    2:58 AM  CMP  Glucose 70 - 99 mg/dL 409   BUN 6 - 20 mg/dL 11   Creatinine 8.11 - 1.00 mg/dL 9.14   Sodium 782 - 956 mmol/L 134   Potassium 3.5 - 5.1 mmol/L 3.9   Chloride 98 - 111 mmol/L 102   CO2 22 - 32 mmol/L 18   Calcium 8.9 - 10.3 mg/dL 9.1   Total Protein 6.5 - 8.1 g/dL 7.3   Total Bilirubin 0.3 - 1.2 mg/dL 0.4   Alkaline Phos 38 - 126 U/L 154   AST 15 - 41 U/L 20   ALT 0 - 44 U/L 18    Edinburgh Score:    03/09/2023   10:28 PM  Edinburgh Postnatal Depression Scale Screening Tool  I have been able to laugh and see the funny side of things. 1  I have looked forward with enjoyment to things. 0  I have blamed myself unnecessarily when things went wrong. 1  I have been anxious or worried for no good reason. 0  I have felt scared or panicky for no good reason.  0  Things have been getting on top of me. 2  I have been so unhappy that I have had difficulty sleeping. 1  I have felt sad or miserable. 0  I have been so unhappy that I have been crying. 1  The thought of harming myself has occurred to me. 0  Edinburgh Postnatal Depression Scale Total 6      After visit meds:  Allergies as of 03/11/2023       Reactions   Ceclor [cefaclor] Hives   Elemental Sulfur Hives   Ferrous Sulfate Nausea And Vomiting        Medication List     TAKE these medications    acetaminophen 325 MG tablet Commonly known as: Tylenol Take 2 tablets (650 mg total) by mouth every 4 (four) hours as needed (for pain scale < 4).   benzocaine-Menthol 20-0.5 % Aero Commonly known as: DERMOPLAST Apply 1 Application topically as needed for irritation (perineal discomfort).   cetirizine 10 MG tablet Commonly known as: ZYRTEC Take 10 mg by mouth daily.   cholecalciferol 25 MCG (1000 UNIT) tablet Commonly known as:  VITAMIN D3 Take 1,000 Units by mouth daily.   ferrous gluconate 324 MG tablet Commonly known as: FERGON Take 324 mg by mouth daily with breakfast.   folic acid 400 MCG tablet Commonly known as: FOLVITE Take 1 tablet every day by oral route as directed for 30 days.   ibuprofen 600 MG tablet Commonly known as: ADVIL Take 1 tablet (600 mg total) by mouth every 6 (six) hours.   lamoTRIgine 100 MG tablet Commonly known as: LAMICTAL Take 100 mg by mouth every evening.   prenatal vitamin w/FE, FA 27-1 MG Tabs tablet Take 1 tablet by mouth daily at 12 noon.         Discharge home in stable condition Infant Feeding: Bottle and Breast Infant Disposition:home with mother Discharge instruction: per After Visit Summary and Postpartum booklet. Activity: Advance as tolerated. Pelvic rest for 6 weeks.  Diet: routine diet Anticipated Birth Control: Unsure Postpartum Appointment:6 weeks Additional Postpartum F/U:    Future Appointments:No future appointments. Follow up Visit:  Follow-up Information     Jaymes Graff, MD Follow up.   Specialty: Obstetrics and Gynecology Contact information: [address] [place] [address] [phone]                     03/14/2023 Michael Litter, MD

## 2023-03-14 NOTE — Telephone Encounter (Signed)
The birth parent called and stated that her breast was warm, purple, and in pain. She is unable to express anything from her breast. She said she has been massaging, using ice, using heat, and pumping. LC encouraged the birth parent to rest the breast, use ice, avoid massage and heat, and pump every 3 hours. LC also told the birth parent to call her doctor. All questions were answered.

## 2023-03-17 ENCOUNTER — Inpatient Hospital Stay (HOSPITAL_COMMUNITY): Admission: AD | Admit: 2023-03-17 | Payer: BC Managed Care – PPO | Source: Home / Self Care

## 2023-03-17 ENCOUNTER — Telehealth (HOSPITAL_COMMUNITY): Payer: Self-pay

## 2023-03-17 NOTE — Telephone Encounter (Signed)
Patient reports that she is doing well. Patient declines questions/concerns about her health and healing.  Patient reports that baby is doing well. Patient reports that she has an appointment with a lactation consultant today to get some help with breastfeeding. Baby sleeps in a bassinet and pack n play. RN reviewed ABC's of safe sleep with patient. Patient declines any questions or concerns about baby.  EPDS score is 7.  [facility]  Perinatal Services   03/17/23,1409

## 2023-12-02 ENCOUNTER — Other Ambulatory Visit: Payer: Self-pay

## 2023-12-02 ENCOUNTER — Ambulatory Visit
Admission: EM | Admit: 2023-12-02 | Discharge: 2023-12-02 | Disposition: A | Discharge status: Home / Self Care | Payer: 59

## 2023-12-02 DIAGNOSIS — S61219A Laceration without foreign body of unspecified finger without damage to nail, initial encounter: Secondary | ICD-10-CM

## 2023-12-02 NOTE — ED Provider Notes (Signed)
 Gloria Christensen    CSN: [id number] Arrival date & time: 12/02/23  1032      History   Chief Complaint Chief Complaint  Patient presents with   finger laceration     HPI Gloria Christensen is a 38 y.o. female.   HPI 38 year old female presents with left index finger laceration.  Patient reports cutting it last night while cooking and thinks it may need to be glued.  PMH significant for obesity, bipolar disorder, and anemia patient occurred last night while cooking.   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 Authorizing Provider  acetaminophen  (TYLENOL ) 325 MG tablet Take 2 tablets (650 mg total) by mouth every 4 (four) hours as needed (for pain scale < 4). 03/11/23   Armond Cape, MD  benzocaine -Menthol  (DERMOPLAST) 20-0.5 % AERO Apply 1 Application topically as needed for irritation (perineal discomfort). 03/11/23   Dillard, Naima, MD  cetirizine (ZYRTEC) 10 MG tablet Take 10 mg by mouth daily.    [provider]  cholecalciferol (VITAMIN D3) 25 MCG (1000 UNIT) tablet  Take 1,000 Units by mouth daily.    [provider]  ferrous gluconate (FERGON) 324 MG tablet Take 324 mg by mouth daily with breakfast.    [provider]  folic acid (FOLVITE) 400 MCG tablet Take 1 tablet every day by oral route as directed for 30 days.    [provider]  ibuprofen  (ADVIL ) 600 MG tablet Take 1 tablet (600 mg total) by mouth every 6 (six) hours. 03/11/23   Armond Cape, MD  lamoTRIgine  (LAMICTAL ) 100 MG tablet Take 100 mg by mouth every evening.  11/14/19   [provider]  prenatal vitamin w/FE, FA (PRENATAL 1 + 1) 27-1 MG TABS tablet Take 1 tablet by mouth daily at 12 noon.    [provider]    Family History Family History  Problem Relation Age of Onset   Asthma Mother    Diabetes Father    Cancer Maternal Grandfather    Heart disease Paternal Grandfather    Colon cancer Other        unknown family member   Hypertension Neg Hx    Stroke Neg Hx  Social History Social History   Tobacco Use   Smoking status: Never   Smokeless tobacco: Never  Vaping Use   Vaping status: Never Used  Substance Use Topics   Alcohol use: Never    Alcohol/week: 0.0 standard drinks of alcohol   Drug use: Never     Allergies   Ceclor [cefaclor], Elemental sulfur, and Ferrous sulfate   Review of Systems Review of Systems  Skin:  Positive for wound.     Physical Exam Triage Vital Signs ED Triage Vitals  Encounter Vitals Group     BP 12/02/23 1134 118/75     Systolic BP Percentile --      Diastolic BP Percentile --      Pulse Rate 12/02/23 1134 71     Resp 12/02/23 1134 16     Temp 12/02/23 1134 97.8 F (36.6 C)     Temp src --      SpO2 12/02/23 1134 100 %     Weight --      Height --      Head Circumference --      Peak Flow --      Pain Score 12/02/23 1132 0     Pain Loc --      Pain Education --      Exclude from Growth Chart --    No data found.  Updated Vital Signs BP 118/75   Pulse 71   Temp 97.8  F (36.6 C)   Resp 16   LMP 11/08/2023 (Approximate)   SpO2 100%   Physical Exam Vitals and nursing note reviewed.  Constitutional:      Appearance: Normal appearance. She is normal weight. She is not ill-appearing.  HENT:     Head: Normocephalic and atraumatic.     Mouth/Throat:     Mouth: Mucous membranes are moist.     Pharynx: Oropharynx is clear.  Eyes:     Extraocular Movements: Extraocular movements intact.     Conjunctiva/sclera: Conjunctivae normal.     Pupils: Pupils are equal, round, and reactive to light.  Cardiovascular:     Rate and Rhythm: Normal rate and regular rhythm.     Pulses: Normal pulses.     Heart sounds: Normal heart sounds.  Pulmonary:     Effort: Pulmonary effort is normal.     Breath sounds: Normal breath sounds.  Musculoskeletal:        General: Normal range of motion.     Cervical back: Normal range of motion and neck supple.  Skin:    General: Skin is warm and dry.     Comments: Left index finger (medial volar aspect of IP): Well-healing 1.0 cm x 1 mm mild laceration, surrounding area painted with benzoin tincture, half-inch x 2 inch Steri-Strip placed with specific instructions.  Neurological:     Mental Status: She is alert.      UC Treatments / Results  Labs (all labs ordered are listed, but only abnormal results are displayed) Labs Reviewed - No data to display  EKG   Radiology No results found.  Procedures Procedures (including critical Christensen time)  Medications Ordered in UC Medications - No data to display  Initial Impression / Assessment and Plan / UC Course  I have reviewed the triage vital signs and the nursing notes.  Pertinent labs & imaging results that were available during my Christensen of the patient were reviewed by me and considered in my medical decision making (see chart for details).  MDM: 1.  Finger laceration, initial encounter-Advised patient to keep the area dry and clean for the next 24 to 36 hours.   Advised Steri-Strip will come off on its own in the next 3 to 5 days.  Advised patient to leave open to air without topical ointments or creams to allow healing by secondary intention/scab formation.  Advised if symptoms worsen and/or unknown resolved please follow-up with PCP or here for further evaluation.  Patient discharged home, hemodynamically stable. Final Clinical Impressions(s) / UC Diagnoses   Final diagnoses:  Finger laceration, initial encounter     Discharge Instructions      Advised patient to keep the area dry and clean for the next 24 to 36 hours.  Advised Steri-Strip will come off on its own in the next 3 to 5 days.  Advised patient to leave open to air without topical ointments or creams to allow healing by secondary intention/scab formation.  Advised if symptoms worsen and/or unknown resolved please follow-up with PCP or here for further evaluation.     ED Prescriptions   None    PDMP not reviewed this encounter.37 year old female presents with left finger   Teddy Sharper, FNP 12/02/23 1205

## 2023-12-02 NOTE — ED Triage Notes (Addendum)
 Pt presents to uc with co left finger lac to left index finger last night while cooking. Pt reports knife cut her. Pt thinks she may need it glued as she is still bleeding. TDAP may 2024

## 2023-12-02 NOTE — Discharge Instructions (Addendum)
 Advised patient to keep the area dry and clean for the next 24 to 36 hours.  Advised Steri-Strip will come off on its own in the next 3 to 5 days.  Advised patient to leave open to air without topical ointments or creams to allow healing by secondary intention/scab formation.  Advised if symptoms worsen and/or unknown resolved please follow-up with PCP or here for further evaluation.

## 2024-10-23 ENCOUNTER — Ambulatory Visit

## 2024-10-23 ENCOUNTER — Ambulatory Visit
Admission: EM | Admit: 2024-10-23 | Discharge: 2024-10-23 | Disposition: A | Discharge status: Home / Self Care | Source: Home / Self Care | Attending: Family Medicine | Admitting: Family Medicine

## 2024-10-23 DIAGNOSIS — R1084 Generalized abdominal pain: Secondary | ICD-10-CM

## 2024-10-23 DIAGNOSIS — R109 Unspecified abdominal pain: Secondary | ICD-10-CM | POA: Diagnosis not present

## 2024-10-23 LAB — POCT URINALYSIS DIP (MANUAL ENTRY)
Bilirubin, UA: NEGATIVE
Blood, UA: NEGATIVE
Glucose, UA: NEGATIVE mg/dL
Leukocytes, UA: NEGATIVE
Nitrite, UA: NEGATIVE
Protein Ur, POC: NEGATIVE mg/dL
Spec Grav, UA: 1.02
Urobilinogen, UA: 0.2 U/dL
pH, UA: 8.5 — AB

## 2024-10-23 LAB — POCT URINE PREGNANCY: Preg Test, Ur: NEGATIVE

## 2024-10-23 MED ORDER — DICYCLOMINE HCL 20 MG PO TABS: 20.0000 mg | ORAL_TABLET | Freq: Two times a day (BID) | ORAL | 0 refills | Status: AC

## 2024-10-23 NOTE — ED Triage Notes (Signed)
 PT presents to the office for abdomen discomfort x 2 days. Patient denies any N,V,D. No injuries or trauma.

## 2024-10-23 NOTE — ED Provider Notes (Signed)
 " Gloria Christensen    CSN: [id number] Arrival date & time: 10/23/24  1234      History   Chief Complaint Chief Complaint  Patient presents with   Abdominal Pain    HPI Gloria Christensen is a 39 y.o. female.   HPI  Patient is here for abdominal pain.  It is all across her lower abdomen.  Her abdomen feels distended.  She states she had a normal bowel movement yesterday.  No diarrhea.  No fever or chills.  She has decreased appetite.  No nausea or vomiting.  She states she can even wear trousers tight across her belly, has to wear leggings and soft clothing.  She is tearful because her family has told her she probably has appendicitis and might need surgery.  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 Authorizing Provider  cetirizine (ZYRTEC) 10 MG tablet Take 10 mg by mouth daily.   Yes [provider]  dicyclomine (BENTYL) 20 MG tablet Take 1 tablet (20 mg total) by mouth 2 (two) times daily. 10/23/24  Yes Maranda Jamee Jacob, MD  acetaminophen  (TYLENOL ) 325 MG tablet Take 2 tablets  (650 mg total) by mouth every 4 (four) hours as needed (for pain scale < 4). 03/11/23   Armond Cape, MD  ibuprofen  (ADVIL ) 600 MG tablet Take 1 tablet (600 mg total) by mouth every 6 (six) hours. 03/11/23   Armond Cape, MD  lamoTRIgine  (LAMICTAL ) 100 MG tablet Take 100 mg by mouth every evening.  11/14/19   [provider]    Family History Family History  Problem Relation Age of Onset   Asthma Mother    Diabetes Father    Cancer Maternal Grandfather    Heart disease Paternal Grandfather    Colon cancer Other        unknown family member   Hypertension Neg Hx    Stroke Neg Hx     Social History Social History[1]   Allergies   Ceclor [cefaclor], Elemental sulfur, and Ferrous sulfate   Review of Systems Review of Systems See HPI  Physical Exam Triage Vital Signs ED Triage Vitals [10/23/24 1246]  Encounter Vitals Group     BP 119/85  Girls Systolic BP Percentile      Girls Diastolic BP Percentile      Boys Systolic BP Percentile      Boys Diastolic BP Percentile      Pulse Rate (!) 104     Resp 18     Temp 98.6 F (37 C)     Temp Source Oral     SpO2 100 %     Weight      Height      Head Circumference      Peak Flow      Pain Score      Pain Loc      Pain Education      Exclude from Growth Chart    No data found.  Updated Vital Signs BP 119/85 (BP Location: Left Arm)   Pulse (!) 104   Temp 98.6 F (37 C) (Oral)   Resp 18   LMP 10/11/2024 (Approximate)   SpO2 100%      Physical Exam Constitutional:      General: She is in acute distress.     Appearance: She is well-developed and normal weight.     Comments: Emotionally upset, worried about health  HENT:     Head: Normocephalic and atraumatic.  Eyes:     Conjunctiva/sclera: Conjunctivae normal.     Pupils: Pupils are equal, round, and reactive to light.  Cardiovascular:     Rate and Rhythm: Normal rate and regular rhythm.     Heart sounds: Normal heart sounds.  Pulmonary:      Effort: Pulmonary effort is normal. No respiratory distress.     Breath sounds: Normal breath sounds.  Abdominal:     General: Abdomen is scaphoid. Bowel sounds are normal. There is no distension.     Palpations: Abdomen is soft.     Tenderness: There is abdominal tenderness in the right lower quadrant. There is no guarding or rebound.  Musculoskeletal:        General: Normal range of motion.     Cervical back: Normal range of motion.  Skin:    General: Skin is warm and dry.  Neurological:     Mental Status: She is alert.      UC Treatments / Results  Labs (all labs ordered are listed, but only abnormal results are displayed) Labs Reviewed  POCT URINALYSIS DIP (MANUAL ENTRY) - Abnormal; Notable for the following components:      Result Value   Ketones, POC UA trace (5) (*)    pH, UA 8.5 (*)    All other components within normal limits  POCT URINE PREGNANCY - Normal    EKG   Radiology DG Abdomen 1 View Result Date: 10/23/2024 EXAM: 1 VIEW XRAY OF THE ABDOMEN 10/23/2024 01:45:37 PM COMPARISON: 11/24/2019 CLINICAL HISTORY: ab pain FINDINGS: LINES, TUBES AND DEVICES: IUD in central pelvis. BOWEL: Nonobstructive bowel gas pattern. Moderate stool retention primarily in right colon. The most likely differential diagnosis based on these findings is constipation. SOFT TISSUES: No abnormal calcifications. BONES: No acute fracture. IMPRESSION: 1. Moderate fecal loading in the right colon, most consistent with constipation Electronically signed by: Waddell Calk MD 10/23/2024 02:15 PM EST RP Workstation: [id number]    Procedures Procedures (including critical Christensen time)  Medications Ordered in UC Medications - No data to display  Initial Impression / Assessment and Plan / UC Course  I have reviewed the triage vital signs and the nursing notes.  Pertinent labs & imaging results that were available during  my Christensen of the patient were reviewed by me and considered in my medical decision  making (see chart for details).     Patient does have moderate tenderness to palpation in right lower quadrant but no guarding or rebound.  Does not feel like an acute abdomen and patient is reassured I do not feel she has appendicitis.  She does have some stool burden.  She states the cramping is really what is bothering her.  Will give her Bentyl for the cramping, advised her to start some MiraLAX.  Increase fluids.  I think this is will pass Final Clinical Impressions(s) / UC Diagnoses   Final diagnoses:  Generalized abdominal pain     Discharge Instructions      Increase your fluid intake Take dicyclomine (Bentyl) for abdominal cramping Take MiraLAX once a day while on Bentyl.  It can cause constipation May take Tylenol  or ibuprofen  for the pain See your doctor if not better by Monday If you have an acute severe worsening of pain, you may need to go to an emergency room   ED Prescriptions     Medication Sig Dispense Auth. Provider   dicyclomine (BENTYL) 20 MG tablet Take 1 tablet (20 mg total) by mouth 2 (two) times daily. 20 tablet Maranda Jamee Jacob, MD      PDMP not reviewed this encounter.    [1]  Social History Tobacco Use   Smoking status: Never   Smokeless tobacco: Never  Vaping Use   Vaping status: Never Used  Substance Use Topics   Alcohol use: Never    Alcohol/week: 0.0 standard drinks of alcohol   Drug use: Never     Maranda Jamee Jacob, MD 10/23/24 1830  "

## 2024-10-23 NOTE — Discharge Instructions (Signed)
 Increase your fluid intake Take dicyclomine (Bentyl) for abdominal cramping Take MiraLAX once a day while on Bentyl.  It can cause constipation May take Tylenol  or ibuprofen  for the pain See your doctor if not better by Monday If you have an acute severe worsening of pain, you may need to go to an emergency room

## 2024-10-24 ENCOUNTER — Emergency Department (HOSPITAL_COMMUNITY)

## 2024-10-24 ENCOUNTER — Other Ambulatory Visit: Payer: Self-pay

## 2024-10-24 ENCOUNTER — Emergency Department (HOSPITAL_COMMUNITY)
Admission: EM | Admit: 2024-10-24 | Discharge: 2024-10-24 | Disposition: A | Discharge status: Home / Self Care | Attending: Emergency Medicine | Admitting: Emergency Medicine

## 2024-10-24 ENCOUNTER — Encounter (HOSPITAL_COMMUNITY): Payer: Self-pay

## 2024-10-24 DIAGNOSIS — W1839XA Other fall on same level, initial encounter: Secondary | ICD-10-CM | POA: Insufficient documentation

## 2024-10-24 DIAGNOSIS — R55 Syncope and collapse: Secondary | ICD-10-CM | POA: Insufficient documentation

## 2024-10-24 DIAGNOSIS — S0993XA Unspecified injury of face, initial encounter: Secondary | ICD-10-CM | POA: Diagnosis present

## 2024-10-24 DIAGNOSIS — S01511A Laceration without foreign body of lip, initial encounter: Secondary | ICD-10-CM | POA: Insufficient documentation

## 2024-10-24 DIAGNOSIS — Y92002 Bathroom of unspecified non-institutional (private) residence single-family (private) house as the place of occurrence of the external cause: Secondary | ICD-10-CM | POA: Insufficient documentation

## 2024-10-24 LAB — CBC
HCT: 35.7 % — ABNORMAL LOW (ref 36.0–46.0)
Hemoglobin: 11.6 g/dL — ABNORMAL LOW (ref 12.0–15.0)
MCH: 27.3 pg (ref 26.0–34.0)
MCHC: 32.5 g/dL (ref 30.0–36.0)
MCV: 84 fL (ref 80.0–100.0)
Platelets: 324 K/uL (ref 150–400)
RBC: 4.25 MIL/uL (ref 3.87–5.11)
RDW: 13.6 % (ref 11.5–15.5)
WBC: 7.2 K/uL (ref 4.0–10.5)
nRBC: 0 % (ref 0.0–0.2)

## 2024-10-24 LAB — COMPREHENSIVE METABOLIC PANEL WITH GFR
ALT: 9 U/L (ref 0–44)
AST: 17 U/L (ref 15–41)
Albumin: 4.4 g/dL (ref 3.5–5.0)
Alkaline Phosphatase: 66 U/L (ref 38–126)
Anion gap: 11 (ref 5–15)
BUN: 12 mg/dL (ref 6–20)
CO2: 26 mmol/L (ref 22–32)
Calcium: 8.6 mg/dL — ABNORMAL LOW (ref 8.9–10.3)
Chloride: 102 mmol/L (ref 98–111)
Creatinine, Ser: 0.65 mg/dL (ref 0.44–1.00)
GFR, Estimated: >60 mL/min
Glucose, Bld: 116 mg/dL — ABNORMAL HIGH (ref 70–99)
Potassium: 3.8 mmol/L (ref 3.5–5.1)
Sodium: 138 mmol/L (ref 135–145)
Total Bilirubin: 0.5 mg/dL (ref 0.0–1.2)
Total Protein: 7.4 g/dL (ref 6.5–8.1)

## 2024-10-24 LAB — URINALYSIS, ROUTINE W REFLEX MICROSCOPIC
Bacteria, UA: NONE SEEN
Bilirubin Urine: NEGATIVE
Glucose, UA: NEGATIVE mg/dL
Hgb urine dipstick: NEGATIVE
Ketones, ur: 5 mg/dL — AB
Leukocytes,Ua: NEGATIVE
Nitrite: NEGATIVE
Protein, ur: 30 mg/dL — AB
Specific Gravity, Urine: 1.028 (ref 1.005–1.030)
pH: 5 (ref 5.0–8.0)

## 2024-10-24 LAB — CBG MONITORING, ED: Glucose-Capillary: 123 mg/dL — ABNORMAL HIGH (ref 70–99)

## 2024-10-24 LAB — HCG, SERUM, QUALITATIVE: Preg, Serum: NEGATIVE

## 2024-10-24 NOTE — Discharge Instructions (Addendum)
 Your workup this evening was reassuring.  Please return to the emergency department if develop any life-threatening symptoms.  Follow-up as needed with your primary care provider.

## 2024-10-24 NOTE — ED Provider Triage Note (Signed)
 Emergency Medicine Provider Triage Evaluation Note  Gloria Christensen , a 39 y.o. female  was evaluated in triage.  Pt complains of syncope.  Patient is dealing with food poisoning and was seen at urgent care earlier today.  She states she was going to the bathroom and felt lightheaded.  Her husband found her on the floor.  She believes she hit her head and has a small laceration to her lower lip and also complains of right sided knee pain.  Review of Systems  Positive:  Negative:   Physical Exam  BP 107/70 (BP Location: Right Arm)   Pulse 98   Temp 98.9 F (37.2 C) (Oral)   Resp 18   Ht 5' 6 (1.676 m)   Wt 68 kg   LMP 10/11/2024 (Approximate)   SpO2 98%   BMI 24.21 kg/m  Gen:   Awake, no distress   Resp:  Normal effort  MSK:   Moves extremities without difficulty  Other:    Medical Decision Making  Medically screening exam initiated at 2:16 AM.  Appropriate orders placed.  Gloria Christensen was informed that the remainder of the evaluation will be completed by another provider, this initial triage assessment does not replace that evaluation, and the importance of remaining in the ED until their evaluation is complete.     Logan Ubaldo NOVAK, PA-C 10/24/24 [phone]

## 2024-10-24 NOTE — ED Provider Notes (Signed)
 " [place] [facility] AT [facility] Provider Note   CSN: [id number] Arrival date & time: 10/24/24  0121     Patient presents with: Loss of Consciousness   Gloria Christensen is a 39 y.o. female.  Patient with presumed foodborne illness with nausea and vomiting for the past 2 days, evaluated by urgent care earlier today presents to the emergency department this evening complaining of an episode of syncope.  She states she was to the bathroom feeling nauseated and her husband heard her fall.  She was found slumped over and patient hit her head as she fell.  She also has a bruise to the right knee.  She is currently tolerating oral intake, complains of mild headache and small laceration to the bottom lip.    Loss of Consciousness      Prior to Admission medications  Medication Sig Start Date End Date Taking? Authorizing Provider  acetaminophen  (TYLENOL ) 325 MG tablet Take 2 tablets (650 mg total) by mouth every 4 (four) hours as needed (for pain scale < 4). 03/11/23   Dillard, Naima, MD  cetirizine (ZYRTEC) 10 MG tablet Take 10 mg by mouth daily.    [provider]  dicyclomine (BENTYL) 20 MG tablet Take 1 tablet (20 mg total) by mouth 2 (two) times daily. 10/23/24   Maranda Jamee Jacob, MD  ibuprofen  (ADVIL ) 600 MG tablet Take 1 tablet (600 mg total) by mouth every 6 (six) hours. 03/11/23   Armond Cape, MD  lamoTRIgine  (LAMICTAL ) 100 MG tablet Take 100 mg by mouth every evening.  11/14/19   [provider]    Allergies: Ceclor [cefaclor], Elemental sulfur, Ferrous sulfate, and Sulfa antibiotics    Review of Systems  Cardiovascular:  Positive for syncope.    Updated Vital Signs BP 100/69   Pulse 85   Temp 98.1 F (36.7 C) (Oral)   Resp 18   Ht 5' 6 (1.676 m)   Wt 68 kg   LMP 10/11/2024 (Approximate)   SpO2 100%   BMI 24.21 kg/m   Physical Exam Vitals and nursing note reviewed.  Constitutional:      General: She is not in acute  distress.    Appearance: She is well-developed.  HENT:     Head: Normocephalic and atraumatic.     Comments: Small laceration with no bleeding noted just below the lip on the bottom lip on the right side.  Small laceration anterior portion of the right lip, no through and through.  No active bleeding. Eyes:     Conjunctiva/sclera: Conjunctivae normal.  Cardiovascular:     Rate and Rhythm: Normal rate and regular rhythm.  Pulmonary:     Effort: Pulmonary effort is normal. No respiratory distress.     Breath sounds: Normal breath sounds.  Abdominal:     Palpations: Abdomen is soft.     Tenderness: There is no abdominal tenderness.  Musculoskeletal:        General: No swelling.     Cervical back: Neck supple.  Skin:    General: Skin is warm and dry.     Capillary Refill: Capillary refill takes less than 2 seconds.  Neurological:     Mental Status: She is alert and oriented to person, place, and time.     Cranial Nerves: No cranial nerve deficit.     Motor: No weakness.     Gait: Gait normal.  Psychiatric:        Mood and Affect: Mood normal.     (  all labs ordered are listed, but only abnormal results are displayed) Labs Reviewed  COMPREHENSIVE METABOLIC PANEL WITH GFR - Abnormal; Notable for the following components:      Result Value   Glucose, Bld 116 (*)    Calcium 8.6 (*)    All other components within normal limits  CBC - Abnormal; Notable for the following components:   Hemoglobin 11.6 (*)    HCT 35.7 (*)    All other components within normal limits  URINALYSIS, ROUTINE W REFLEX MICROSCOPIC - Abnormal; Notable for the following components:   Color, Urine AMBER (*)    APPearance HAZY (*)    Ketones, ur 5 (*)    Protein, ur 30 (*)    All other components within normal limits  CBG MONITORING, ED - Abnormal; Notable for the following components:   Glucose-Capillary 123 (*)    All other components within normal limits  HCG, SERUM, QUALITATIVE     EKG: None  Radiology: CT Head Wo Contrast Result Date: 10/24/2024 EXAM: CT HEAD WITHOUT 10/24/2024 02:01:39 AM TECHNIQUE: CT of the head was performed without the administration of intravenous contrast. Automated exposure control, iterative reconstruction, and/or weight based adjustment of the mA/kV was utilized to reduce the radiation dose to as low as reasonably achievable. COMPARISON: None available. CLINICAL HISTORY: Syncope/presyncope, cerebrovascular cause suspected FINDINGS: BRAIN AND VENTRICLES: No acute intracranial hemorrhage. No mass effect or midline shift. No extra-axial fluid collection. No evidence of acute infarct. No hydrocephalus. ORBITS: No acute abnormality. SINUSES AND MASTOIDS: No acute abnormality. SOFT TISSUES AND SKULL: No acute skull fracture. No acute soft tissue abnormality. IMPRESSION: 1. No acute intracranial abnormality. Electronically signed by: Franky Stanford MD 10/24/2024 02:04 AM EST RP Workstation: [id number]   DG Abdomen 1 View Result Date: 10/23/2024 EXAM: 1 VIEW XRAY OF THE ABDOMEN 10/23/2024 01:45:37 PM COMPARISON: 11/24/2019 CLINICAL HISTORY: ab pain FINDINGS: LINES, TUBES AND DEVICES: IUD in central pelvis. BOWEL: Nonobstructive bowel gas pattern. Moderate stool retention primarily in right colon. The most likely differential diagnosis based on these findings is constipation. SOFT TISSUES: No abnormal calcifications. BONES: No acute fracture. IMPRESSION: 1. Moderate fecal loading in the right colon, most consistent with constipation Electronically signed by: Waddell Calk MD 10/23/2024 02:15 PM EST RP Workstation: [id number]     Procedures   Medications Ordered in the ED - No data to display                                  Medical Decision Making Amount and/or Complexity of Data Reviewed Labs: ordered. Radiology: ordered.   This patient presents to the ED for concern of syncope, this involves an extensive number of treatment options, and is a  complaint that carries with it a high risk of complications and morbidity.  The differential diagnosis includes vasovagal syncope, dehydration, dysrhythmia, intracranial abnormality, others   Co morbidities / Chronic conditions that complicate the patient evaluation  History of SVT, anemia   Additional history obtained:  Additional history obtained from EMR External records from outside source obtained and reviewed including urgent care notes   Lab Tests:  I Ordered, and personally interpreted labs.  The pertinent results include: Grossly unremarkable CBC, CMP, UA, negative pregnancy test   Imaging Studies ordered:  I ordered imaging studies including head CT I independently visualized and interpreted imaging which showed no acute findings I agree with the radiologist interpretation   Cardiac Monitoring: / EKG:  The patient was maintained on a cardiac monitor.  I personally viewed and interpreted the cardiac monitored which showed an underlying rhythm of: Sinus rhythm   Test / Admission - Considered:  Patient with no acute findings on workup.  No sign of dysrhythmia, no acute intracranial abnormality noted.  Patient is ambulating and tolerating oral intake without difficulty.  Question of vasovagal response as patient was trying to go to the bathroom when the incident happened.  At this time I see no indication for further emergent workup.  Patient appears stable for discharge home.  Return precautions have been provided.      Final diagnoses:  Syncope, unspecified syncope type    ED Discharge Orders     None          Logan Ubaldo KATHEE DEVONNA 10/24/24 [id number]    Midge Golas, MD 10/24/24 [phone]  "

## 2024-10-24 NOTE — ED Triage Notes (Signed)
 Patient reports losing consciousness tonight, while in her bathroom. States she went to the bathroom because she was feeling nauseous. Patient states her husband heard her fall and she was slumped over. Patient thinks she hit the back of her head. Also has a bruise to the lip and right knee.   Patient was seen at urgent care yesterday related to food poisoning. Patient has been vomiting after getting sick New Years Eve. No blood thinners.
